# Patient Record
Sex: Female | Born: 1955 | Race: White | Hispanic: No | Smoking: Former smoker
Health system: Southern US, Community
[De-identification: ages and names within clinical notes are randomized; demographics above are authoritative.]

## PROBLEM LIST (undated history)

## (undated) DIAGNOSIS — R61 Generalized hyperhidrosis: Secondary | ICD-10-CM

## (undated) DIAGNOSIS — Z789 Other specified health status: Secondary | ICD-10-CM

## (undated) DIAGNOSIS — R350 Frequency of micturition: Secondary | ICD-10-CM

## (undated) DIAGNOSIS — G4700 Insomnia, unspecified: Secondary | ICD-10-CM

## (undated) DIAGNOSIS — R232 Flushing: Secondary | ICD-10-CM

## (undated) DIAGNOSIS — T7840XA Allergy, unspecified, initial encounter: Secondary | ICD-10-CM

## (undated) DIAGNOSIS — Z9889 Other specified postprocedural states: Secondary | ICD-10-CM

## (undated) DIAGNOSIS — R112 Nausea with vomiting, unspecified: Secondary | ICD-10-CM

## (undated) HISTORY — PX: SHOULDER SURGERY: SHX246

## (undated) HISTORY — PX: OOPHORECTOMY: SHX86

## (undated) HISTORY — PX: UTERINE FIBROID SURGERY: SHX826

## (undated) HISTORY — DX: Generalized hyperhidrosis: R61

## (undated) HISTORY — PX: TUBAL LIGATION: SHX77

## (undated) HISTORY — DX: Frequency of micturition: R35.0

## (undated) HISTORY — DX: Allergy, unspecified, initial encounter: T78.40XA

## (undated) HISTORY — PX: ENDOMETRIAL ABLATION: SHX621

## (undated) HISTORY — DX: Insomnia, unspecified: G47.00

## (undated) HISTORY — DX: Flushing: R23.2

---

## 1997-11-08 ENCOUNTER — Ambulatory Visit (HOSPITAL_COMMUNITY): Admission: RE | Admit: 1997-11-08 | Discharge: 1997-11-08 | Payer: Self-pay | Admitting: Obstetrics and Gynecology

## 1997-11-08 ENCOUNTER — Encounter: Payer: Self-pay | Admitting: Obstetrics and Gynecology

## 1997-11-14 ENCOUNTER — Encounter: Payer: Self-pay | Admitting: Obstetrics and Gynecology

## 1997-11-14 ENCOUNTER — Ambulatory Visit (HOSPITAL_COMMUNITY): Admission: RE | Admit: 1997-11-14 | Discharge: 1997-11-14 | Payer: Self-pay | Admitting: Obstetrics and Gynecology

## 1997-12-05 ENCOUNTER — Other Ambulatory Visit: Admission: RE | Admit: 1997-12-05 | Discharge: 1997-12-05 | Payer: Self-pay | Admitting: Obstetrics and Gynecology

## 1998-03-30 ENCOUNTER — Ambulatory Visit (HOSPITAL_COMMUNITY): Admission: RE | Admit: 1998-03-30 | Discharge: 1998-03-30 | Payer: Self-pay | Admitting: Obstetrics and Gynecology

## 1998-12-26 ENCOUNTER — Other Ambulatory Visit: Admission: RE | Admit: 1998-12-26 | Discharge: 1998-12-26 | Payer: Self-pay | Admitting: Obstetrics and Gynecology

## 2000-02-12 ENCOUNTER — Other Ambulatory Visit: Admission: RE | Admit: 2000-02-12 | Discharge: 2000-02-12 | Payer: Self-pay | Admitting: Obstetrics and Gynecology

## 2000-04-03 ENCOUNTER — Ambulatory Visit (HOSPITAL_COMMUNITY): Admission: RE | Admit: 2000-04-03 | Discharge: 2000-04-03 | Payer: Self-pay | Admitting: Obstetrics and Gynecology

## 2000-04-03 ENCOUNTER — Encounter (INDEPENDENT_AMBULATORY_CARE_PROVIDER_SITE_OTHER): Payer: Self-pay | Admitting: *Deleted

## 2000-04-16 ENCOUNTER — Encounter: Payer: Self-pay | Admitting: Specialist

## 2000-04-23 ENCOUNTER — Ambulatory Visit (HOSPITAL_COMMUNITY): Admission: RE | Admit: 2000-04-23 | Discharge: 2000-04-23 | Payer: Self-pay | Admitting: Specialist

## 2000-04-23 ENCOUNTER — Encounter: Payer: Self-pay | Admitting: Specialist

## 2001-02-23 ENCOUNTER — Other Ambulatory Visit: Admission: RE | Admit: 2001-02-23 | Discharge: 2001-02-23 | Payer: Self-pay | Admitting: Obstetrics and Gynecology

## 2002-05-25 ENCOUNTER — Other Ambulatory Visit: Admission: RE | Admit: 2002-05-25 | Discharge: 2002-05-25 | Payer: Self-pay | Admitting: Obstetrics and Gynecology

## 2003-06-06 ENCOUNTER — Other Ambulatory Visit: Admission: RE | Admit: 2003-06-06 | Discharge: 2003-06-06 | Payer: Self-pay | Admitting: Obstetrics and Gynecology

## 2003-08-20 ENCOUNTER — Encounter: Admission: RE | Admit: 2003-08-20 | Discharge: 2003-08-20 | Payer: Self-pay | Admitting: Specialist

## 2004-05-17 ENCOUNTER — Ambulatory Visit: Payer: Self-pay | Admitting: Internal Medicine

## 2004-05-25 ENCOUNTER — Ambulatory Visit: Payer: Self-pay | Admitting: Internal Medicine

## 2004-08-14 ENCOUNTER — Other Ambulatory Visit: Admission: RE | Admit: 2004-08-14 | Discharge: 2004-08-14 | Payer: Self-pay | Admitting: Obstetrics and Gynecology

## 2005-06-18 ENCOUNTER — Emergency Department (HOSPITAL_COMMUNITY): Admission: EM | Admit: 2005-06-18 | Discharge: 2005-06-18 | Payer: Self-pay | Admitting: Emergency Medicine

## 2005-10-25 ENCOUNTER — Ambulatory Visit (HOSPITAL_COMMUNITY): Admission: RE | Admit: 2005-10-25 | Discharge: 2005-10-25 | Payer: Self-pay | Admitting: Obstetrics and Gynecology

## 2005-11-27 ENCOUNTER — Ambulatory Visit: Payer: Self-pay | Admitting: Family Medicine

## 2006-08-08 ENCOUNTER — Ambulatory Visit (HOSPITAL_COMMUNITY): Admission: RE | Admit: 2006-08-08 | Discharge: 2006-08-08 | Payer: Self-pay | Admitting: Obstetrics & Gynecology

## 2006-10-03 ENCOUNTER — Encounter: Payer: Self-pay | Admitting: Obstetrics & Gynecology

## 2006-10-03 ENCOUNTER — Ambulatory Visit (HOSPITAL_COMMUNITY): Admission: EM | Admit: 2006-10-03 | Discharge: 2006-10-03 | Payer: Self-pay | Admitting: Obstetrics & Gynecology

## 2006-11-11 ENCOUNTER — Ambulatory Visit (HOSPITAL_COMMUNITY): Admission: RE | Admit: 2006-11-11 | Discharge: 2006-11-11 | Payer: Self-pay | Admitting: Obstetrics & Gynecology

## 2007-03-06 ENCOUNTER — Ambulatory Visit (HOSPITAL_COMMUNITY): Admission: RE | Admit: 2007-03-06 | Discharge: 2007-03-06 | Payer: Self-pay | Admitting: Specialist

## 2007-11-24 ENCOUNTER — Ambulatory Visit (HOSPITAL_COMMUNITY): Admission: RE | Admit: 2007-11-24 | Discharge: 2007-11-24 | Payer: Self-pay | Admitting: Obstetrics & Gynecology

## 2008-04-04 IMAGING — CT CT ANGIO CHEST
1 of 2 series · 20 of 30 positions shown · IV contrast (APPLIED)
Comparison: None.

CLINICAL DATA: Chest pain/shortness of breath.
 CT ANGIOGRAPHY OF CHEST:
TECHNIQUE: Multidetector CT imaging of the chest was performed during bolus injection of intravenous contrast.  Multiplanar CT angiographic image reconstructions were generated to evaluate the vascular anatomy.
 Contrast:  80 cc Omnipaque 300

[Series 5: pe 1.0 b20f st · axial · 0.70mm/px · z∈[-289,-35]mm · 20 of 284 slices shown]
[im 15/284  lung]
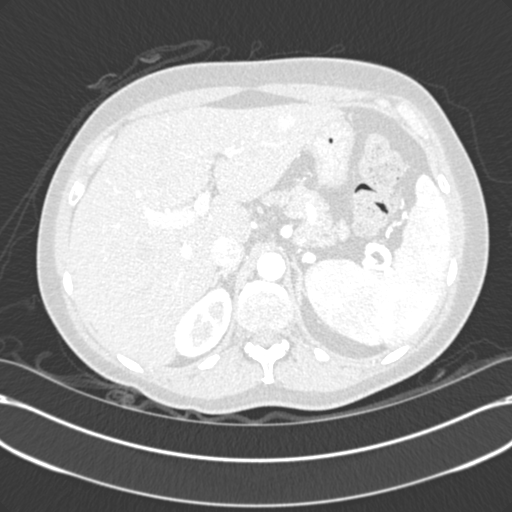
[im 29/284  mediastinal]
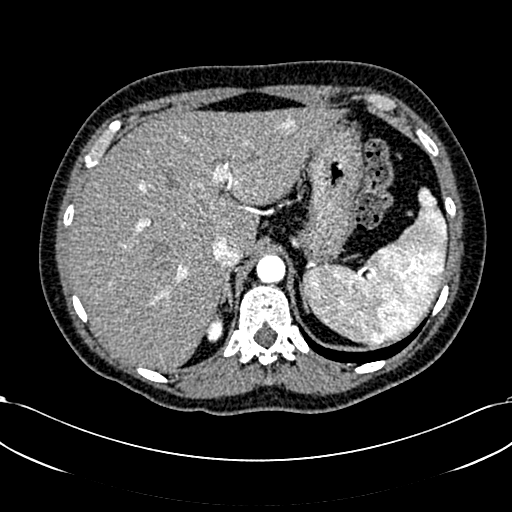
[im 43/284  lung]
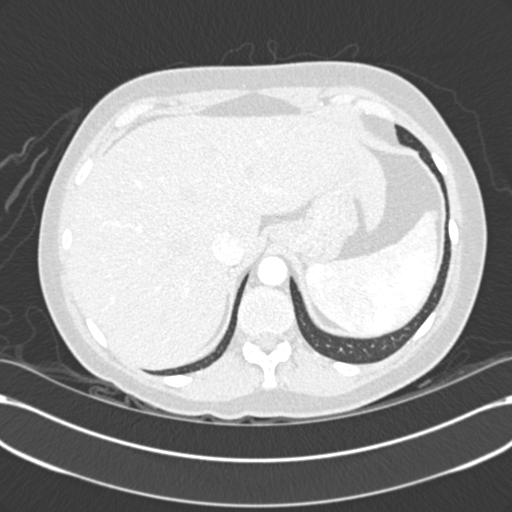
[im 57/284  mediastinal]
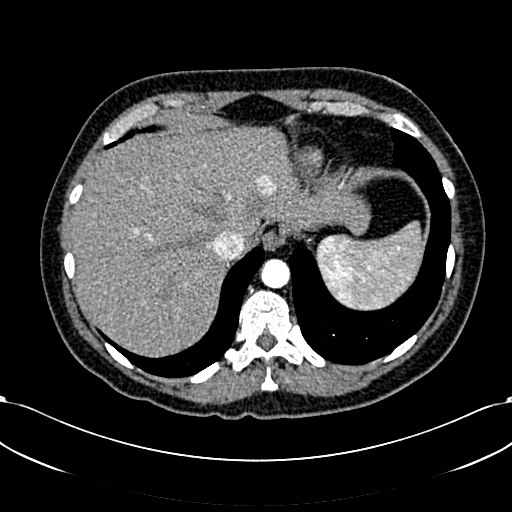
[im 71/284  lung]
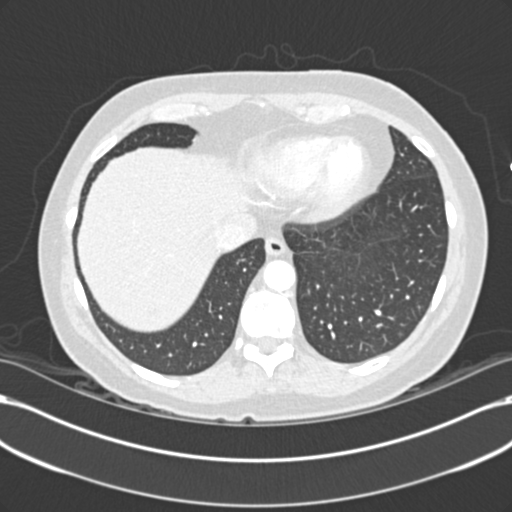
[im 85/284  mediastinal]
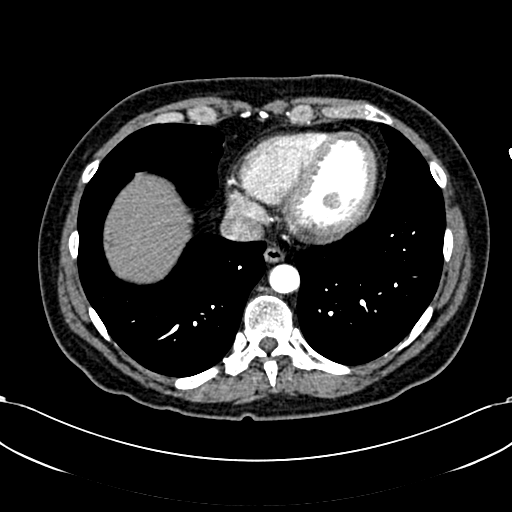
[im 100/284  lung]
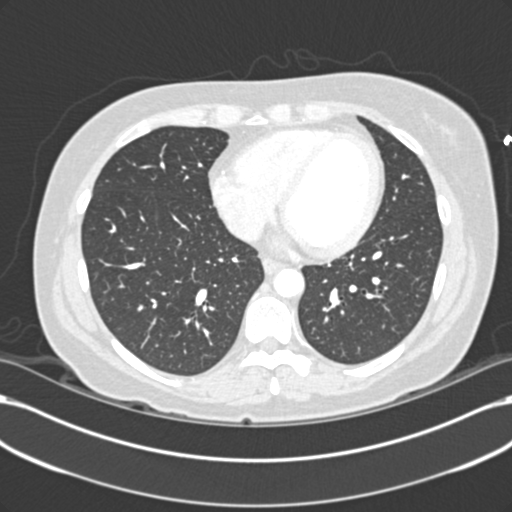
[im 114/284  mediastinal]
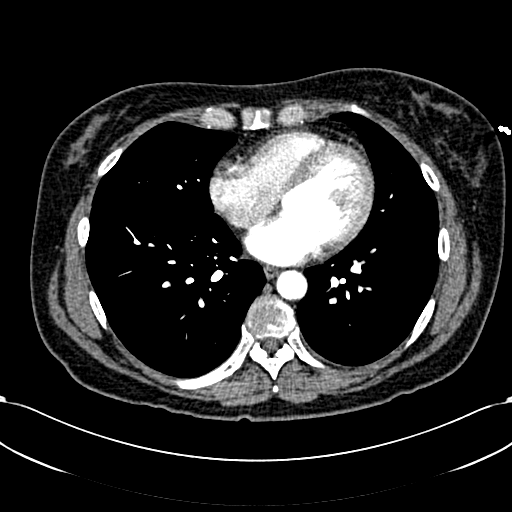
[im 128/284  lung]
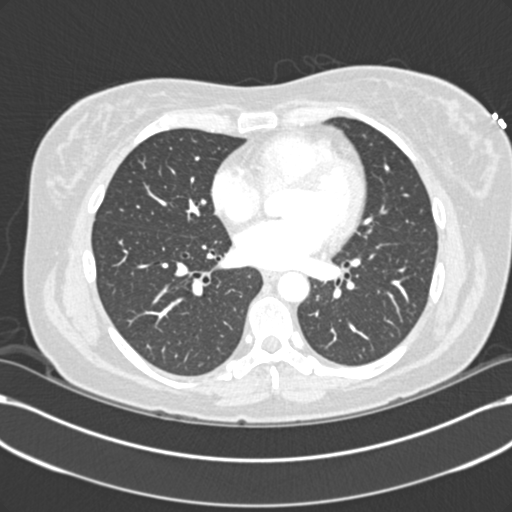
[im 133/284  mediastinal]
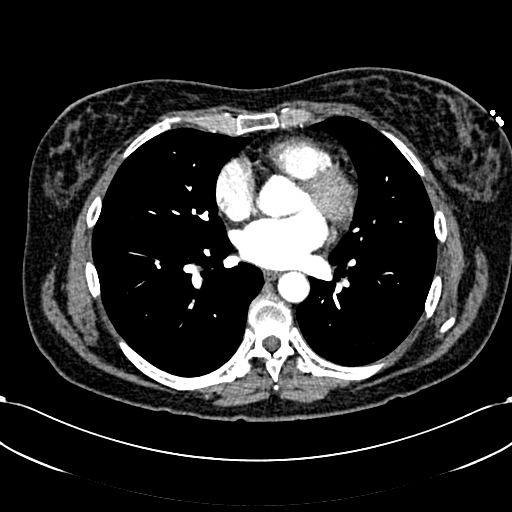
[im 142/284  lung]
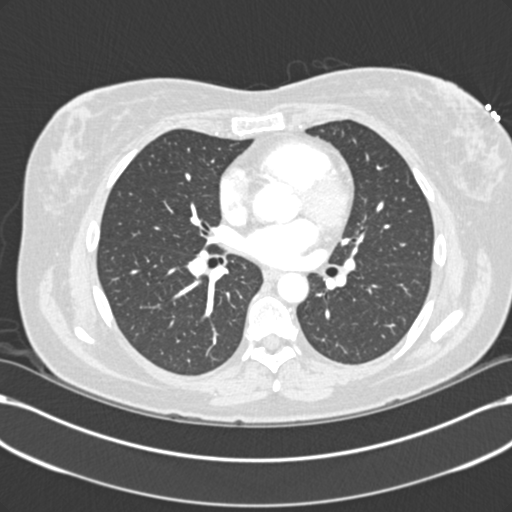
[im 156/284  mediastinal]
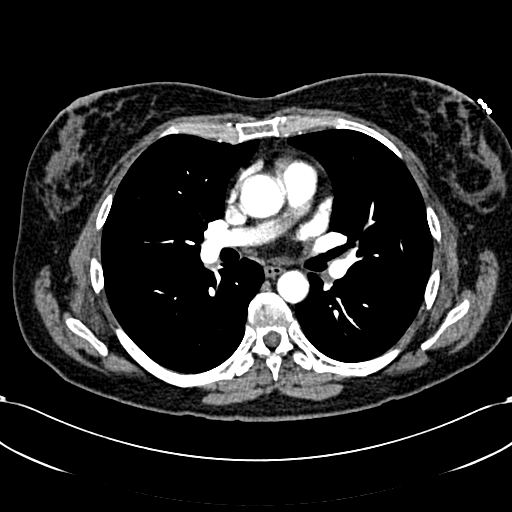
[im 170/284  lung]
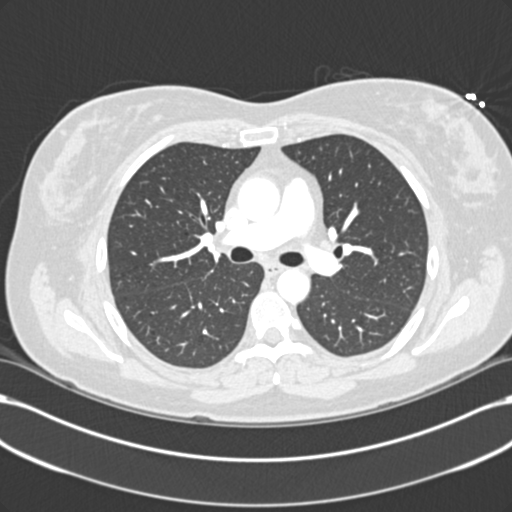
[im 184/284  mediastinal]
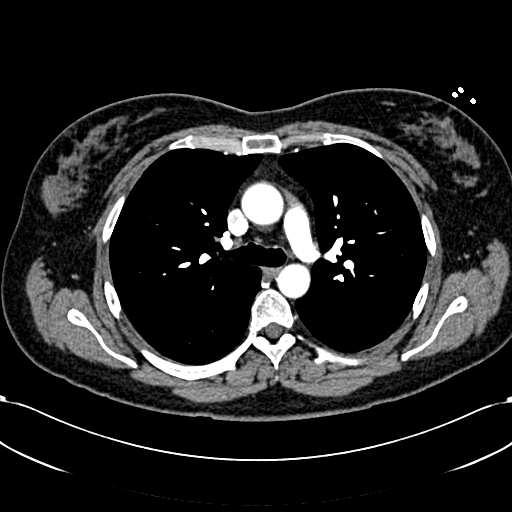
[im 199/284  lung]
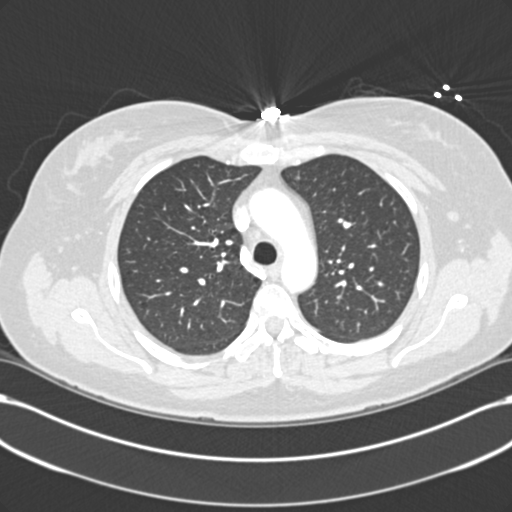
[im 213/284  mediastinal]
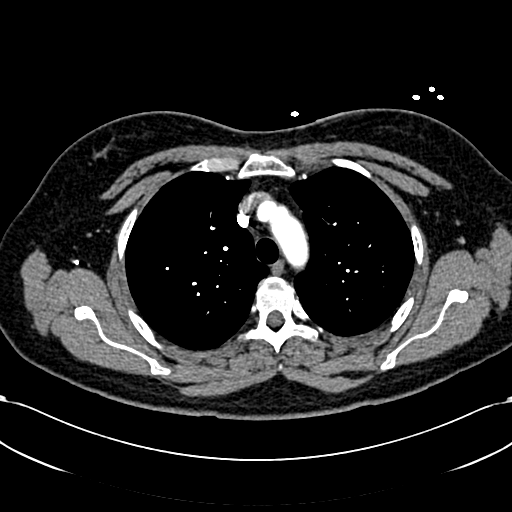
[im 227/284  lung]
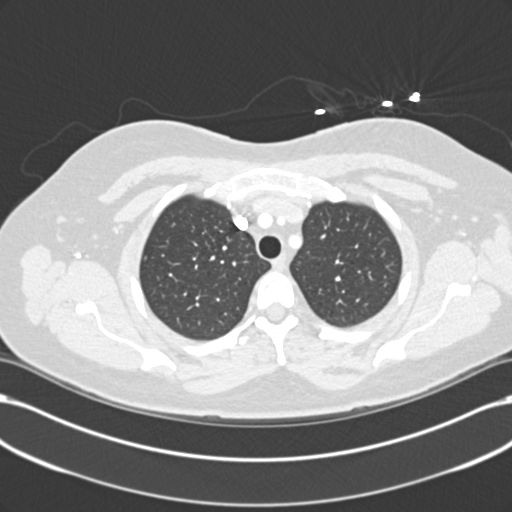
[im 241/284  mediastinal]
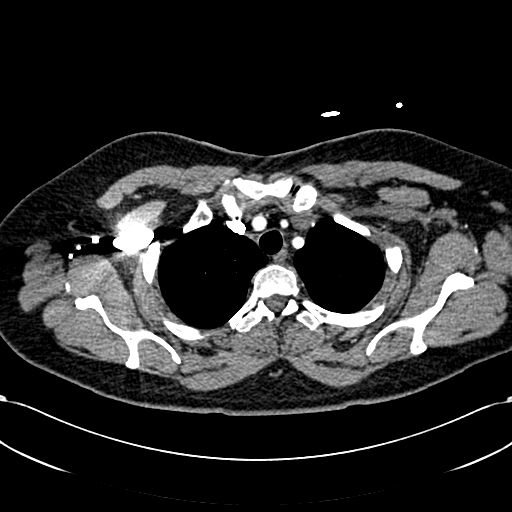
[im 255/284  lung]
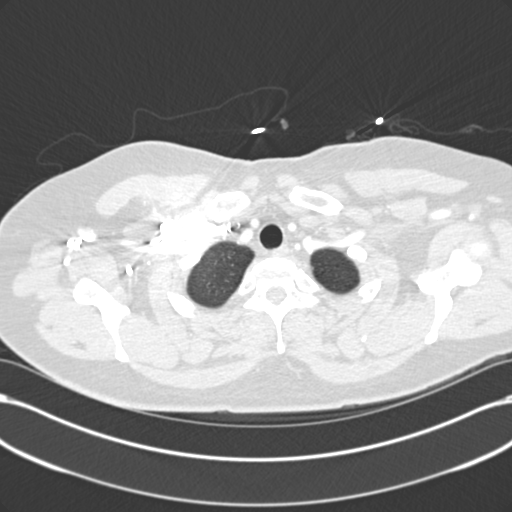
[im 269/284  mediastinal]
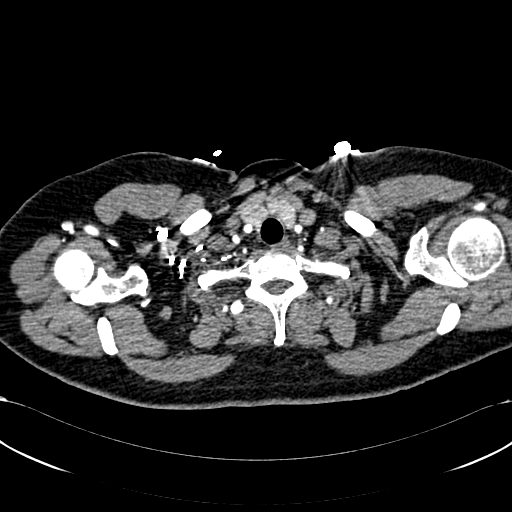

[20 of 30 positions shown; findings below may reference images not displayed]

FINDINGS: In 3 planes, no pulmonary emboli are visualized.
 Thoracic aorta normal. No mediastinal adenopathy. The lungs are clear.
 Lowest cuts include some of the upper abdominal structures.  An 11 mm simple cyst is incidentally noted in the dome of the liver.
IMPRESSION: 1.  No pulmonary emboli.
 2.  Aorta normal.
 3.  Lungs clear.

## 2008-12-13 ENCOUNTER — Ambulatory Visit (HOSPITAL_COMMUNITY): Admission: RE | Admit: 2008-12-13 | Discharge: 2008-12-13 | Payer: Self-pay | Admitting: Obstetrics & Gynecology

## 2009-03-10 ENCOUNTER — Ambulatory Visit (HOSPITAL_COMMUNITY): Admission: RE | Admit: 2009-03-10 | Discharge: 2009-03-10 | Payer: Self-pay | Admitting: Obstetrics & Gynecology

## 2009-05-25 IMAGING — US US PELVIS COMPLETE MODIFY
1 series · 13 of 25 positions shown · non-contrast
Comparison: None.

CLINICAL DATA: Abnormal uterine bleeding.  Previous left oophorectomy for benign ovarian cyst.
TRANSABDOMINAL AND TRANSVAGINAL PELVIC ULTRASOUND:
TECHNIQUE: Both transabdominal and transvaginal ultrasound examinations of the pelvis were performed including evaluation of the uterus, ovaries, adnexal regions, and pelvic cul-de-sac.

[Series 1: us pelvis complete modify · 0.28mm/px · 13 of 44 slices shown]
[im 1/44]
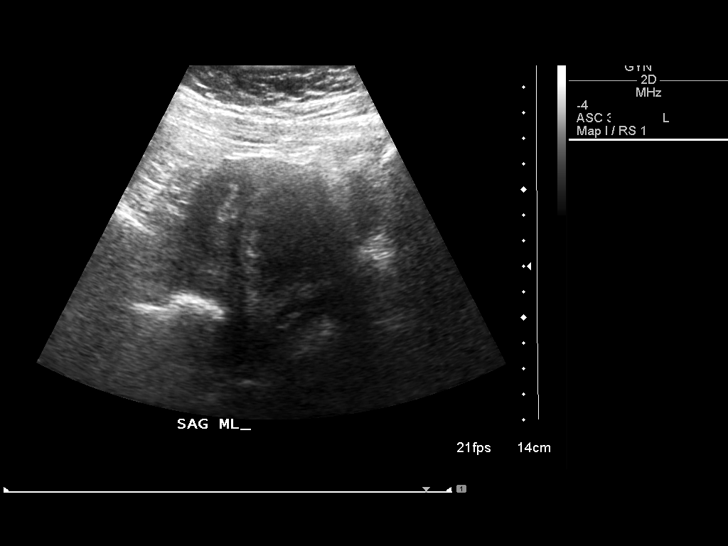
[im 4/44]
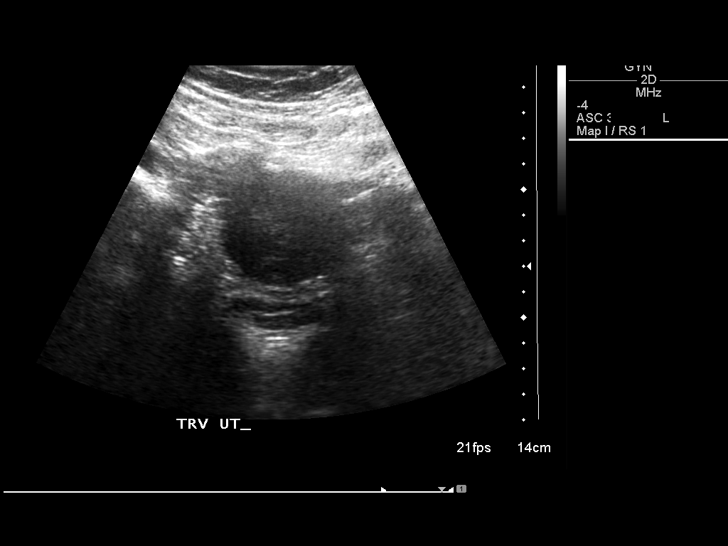
[im 8/44]
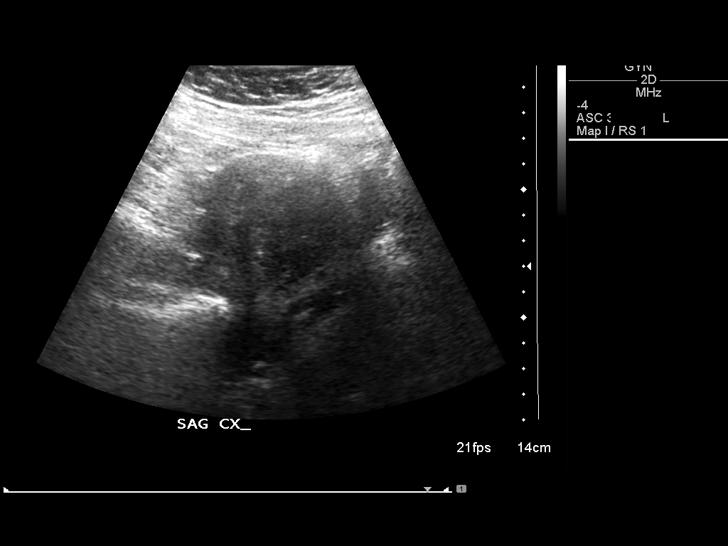
[im 11/44]
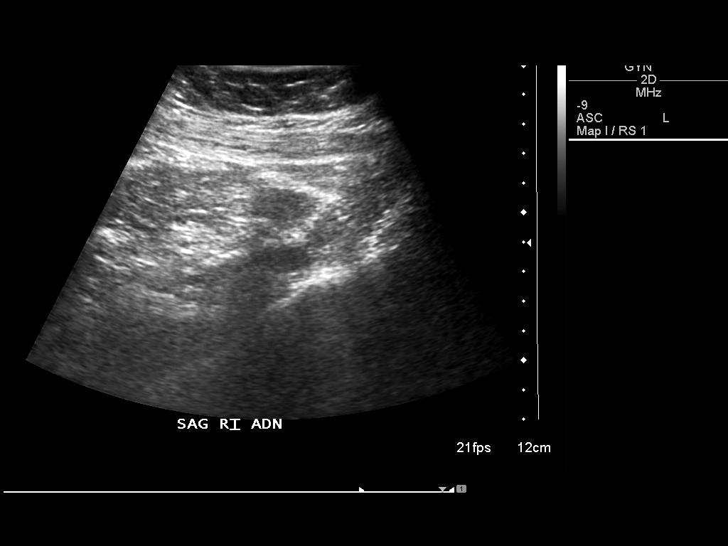
[im 15/44]
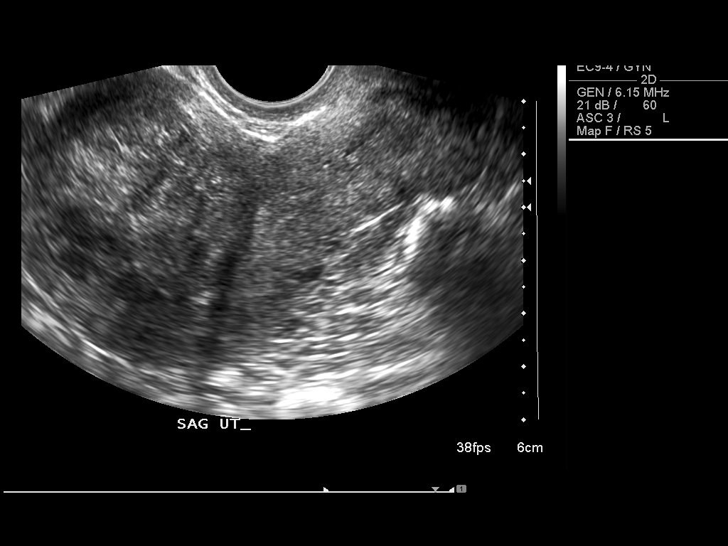
[im 18/44]
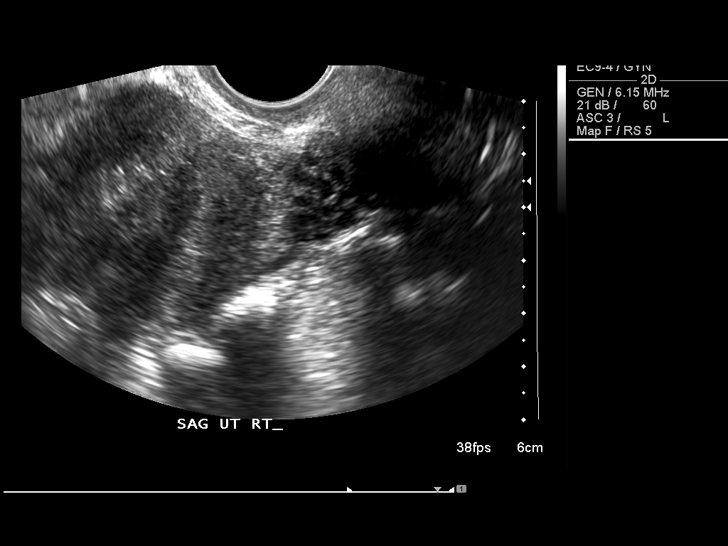
[im 22/44]
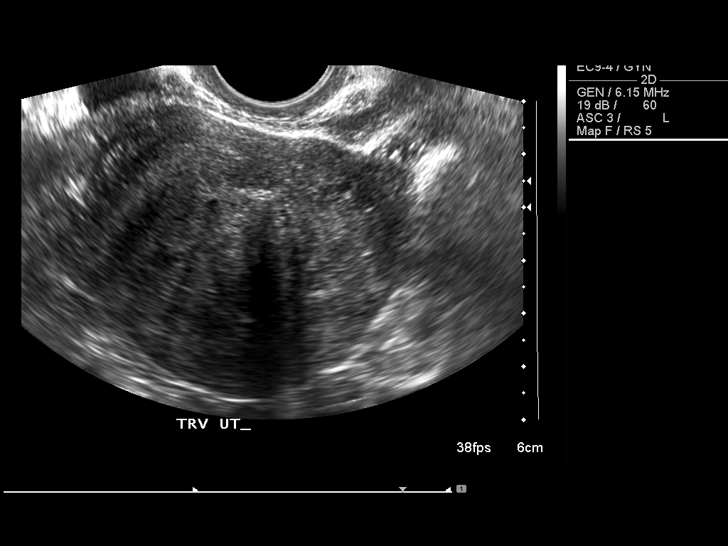
[im 26/44]
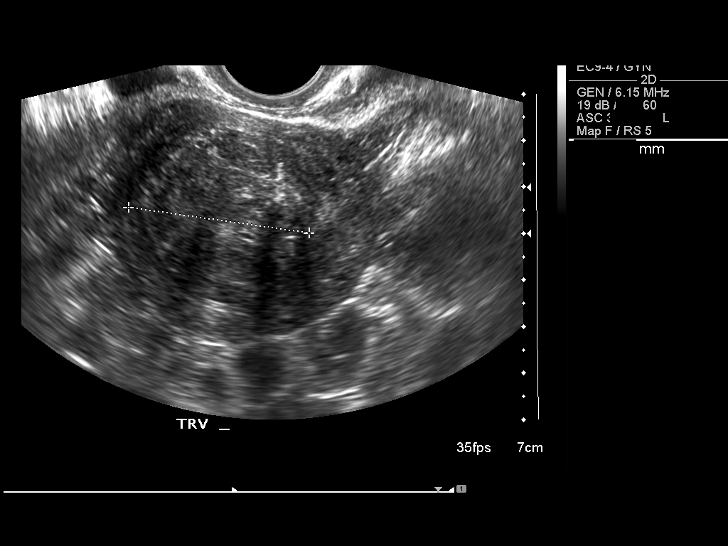
[im 29/44]
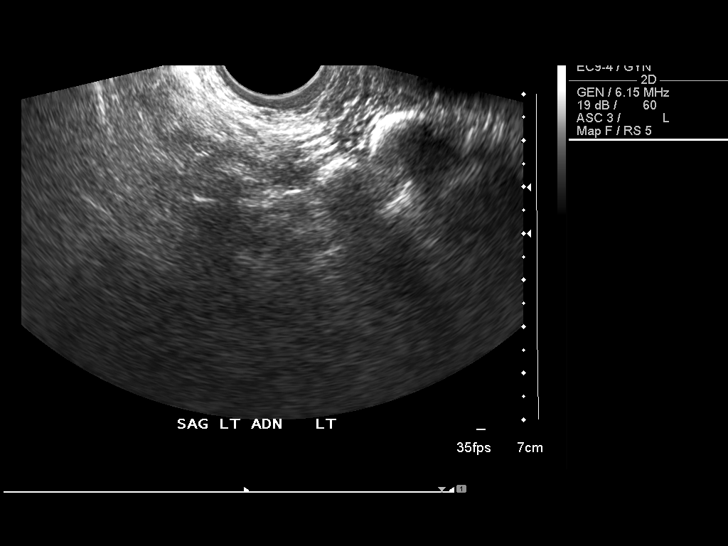
[im 33/44]
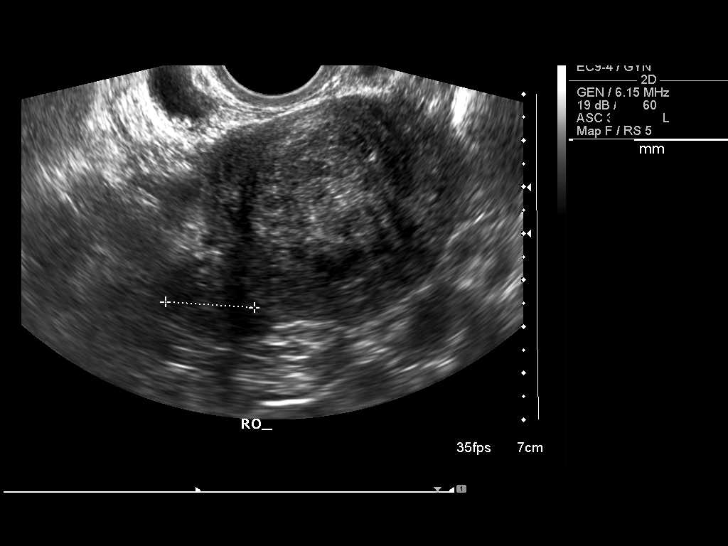
[im 36/44]
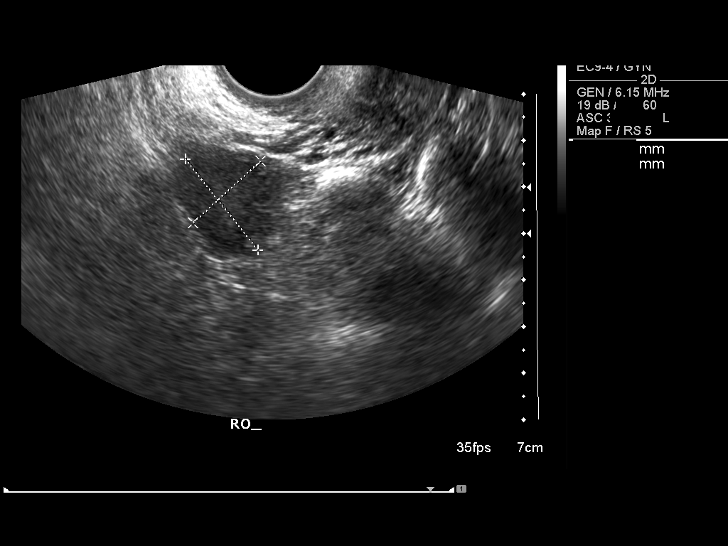
[im 40/44]
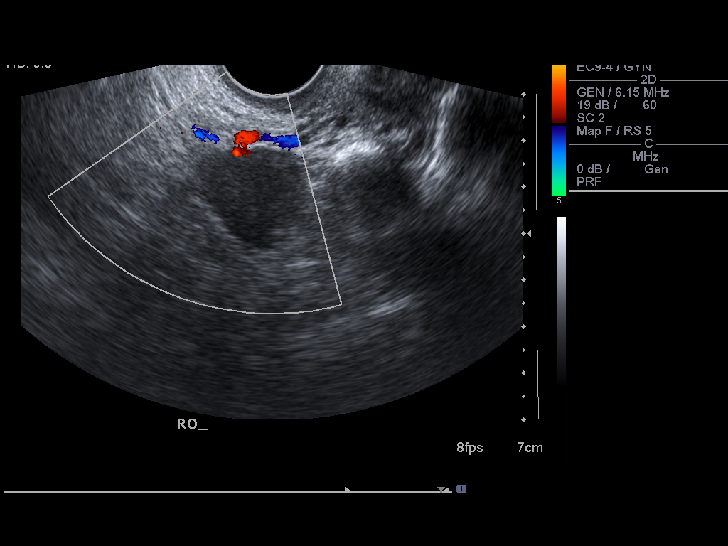
[im 44/44]
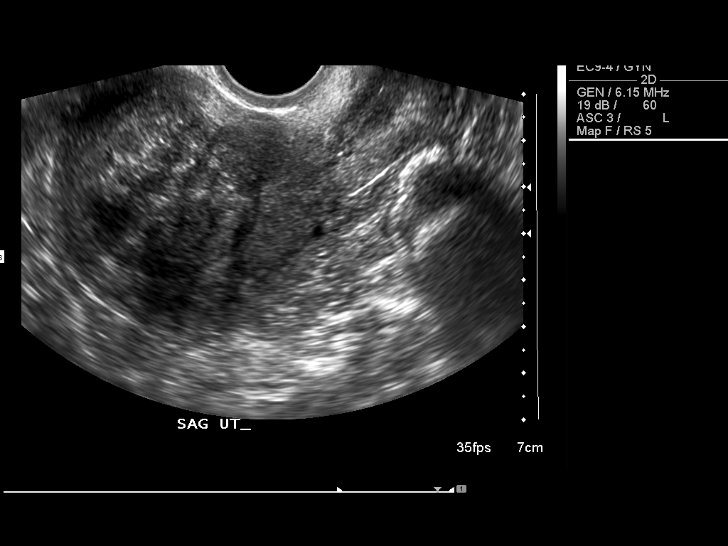

[13 of 25 positions shown; findings below may reference images not displayed]

FINDINGS: The uterus is within normal limits in size measuring approximately 9 x 6 x 6 cm.  A large central fibroid is seen in the anterior uterine body and fundus which has a significant submucosal component displacing the endometrium posteriorly. This measures 4.2 x 3.9 x 3.9 cm.  A 2nd subserosal fibroid is seen projecting from the right uterine fundus which measures 2.2 x 2.3 x 2.1 cm.  
The right ovary is normal in appearance.  Left ovary is not visualized consistent with prior history of left oophorectomy.  No adnexal masses or free fluid are identified by either transabdominal or transvaginal sonography.
IMPRESSION: 1.  Large central uterine fibroid with submucosal component, measuring 4.2 cm.  Second subserosal fibroid in the right uterine fundus measuring 2.3 cm.  
2.  Normal right ovary.  Previous left oophorectomy.

## 2009-12-20 ENCOUNTER — Ambulatory Visit (HOSPITAL_COMMUNITY)
Admission: RE | Admit: 2009-12-20 | Discharge: 2009-12-20 | Payer: Self-pay | Source: Home / Self Care | Attending: Obstetrics & Gynecology | Admitting: Obstetrics & Gynecology

## 2010-02-19 ENCOUNTER — Other Ambulatory Visit: Payer: Self-pay | Admitting: Obstetrics & Gynecology

## 2010-04-04 LAB — CBC
Hemoglobin: 12.4 g/dL (ref 12.0–15.0)
MCV: 88.1 fL (ref 78.0–100.0)
RDW: 13.5 % (ref 11.5–15.5)

## 2010-05-29 NOTE — Op Note (Signed)
NAMESANDEE, Colleen [name] NO.:  [id number]   MEDICAL RECORD NO.:  [id number]          PATIENT TYPE:  AMB   LOCATION:  [facility]                           FACILITY:  [facility]   PHYSICIAN:  Roseanna Rainbow, M.D.DATE OF BIRTH:  08-Jun-1955   DATE OF PROCEDURE:  10/03/2006  DATE OF DISCHARGE:                               OPERATIVE REPORT   PREOPERATIVE DIAGNOSIS:  Abnormal uterine bleeding, small myomatous  uterus.   POSTOPERATIVE DIAGNOSIS:  Abnormal uterine bleeding, small myomatous  uterus.   PROCEDURE:  Dilatation and curettage, diagnostic hysteroscopy, and  NovaSure endometrial ablation.   SURGEON:  Roseanna Rainbow, M.D.   ANESTHESIA:  Managed anesthesia care, paracervical block.   PATHOLOGY:  Endometrial curettings.   ESTIMATED BLOOD LOSS:  Minimal.   COMPLICATIONS:  None.   FINDINGS:  Upon hysteroscopic survey of the endometrial cavity, the left  tubal ostia was well visualized.  There was a left sided anterior wall  submucous myoma that was lateral near the cornu.  There were some the  synechiae in the right cornua. The right tubal ostia was not well  visualized. After the ablation cycle had been completed, the  hysteroscope was reintroduced into the endometrial cavity and it was  found that the endometrium covering the myoma was not cauterized as well  as the left cornu and sidewall of the left cornu.   DESCRIPTION OF PROCEDURE:  The patient was taken to the operating room  with IV fluid running.  She was placed in the dorsal lithotomy position  and prepped and draped in the usual sterile fashion.  After a time out  had been completed, the Sims retractors were placed into the vagina.  The anterior lip of the cervix was infiltrated with 2 mL of 1%  lidocaine.  The single tooth tenaculum was then applied to this  location.  4 mL of 1% lidocaine were then injected at 4 and 7 o'clock to  produce a paracervical block.  The uterus sounded to 10 cm.  The  endocervical canal was measured using a tapered Hegar dilator and this  measurement was 4.5 cm.  The cervix was then dilated with [facility]  dilators.  A diagnostic hysteroscopy was then performed with the above  findings noted.  The hysteroscope was then removed.  The NovaSure device  was then advanced into the uterine fundus.  The device was then seated.  The inner cornual width was measured at 3.2 cm and despite several  attempts, the device would not expand beyond the 3.2 cm.  The cavity  integrity test was then performed and passed.  The ablation cycle was  performed and completed.  The device was then removed.  A repeat  hysteroscopic survey of the endometrial cavity was performed with the  above findings noted.  Minimal deficit was noted for the distending  medium.  The single tooth tenaculum was then removed with minimal  bleeding noted from the cervix.  At the close of the procedure, the  instrument and pack counts were said to be correct x2.  The patient was  taken to the  PACU awake and in stable condition.      Roseanna Rainbow, M.D.  Electronically Signed     LAJ/MEDQ  D:  10/03/2006  T:  10/04/2006  Job:  [id number]

## 2010-05-29 NOTE — Op Note (Signed)
[name], [name] NO.:  [id number]   MEDICAL RECORD NO.:  [id number]          PATIENT TYPE:  AMB   LOCATION:  DAY                          FACILITY:  [facility]   PHYSICIAN:  Jene Every, M.D.    DATE OF BIRTH:  02-12-1955   DATE OF PROCEDURE:  03/06/2007  DATE OF DISCHARGE:                               OPERATIVE REPORT   PREOPERATIVE DIAGNOSIS:  Adhesive capsulitis and impingement syndrome  with possible rotator cuff tear, left shoulder.   POSTOPERATIVE DIAGNOSIS:  Adhesive capsulitis and impingement syndrome  with possible rotator cuff tear, left shoulder.   PROCEDURE PERFORMED:  1. Exam under anesthesia and manipulation under anesthesia of the left      shoulder.  2. Left shoulder arthroscopy, subacromial decompression, bursectomy,      and debridement of labrum.   ANESTHESIA:  General.   SURGEON:  Jene Every, M.D.   ASSISTANT:  Colleen [name], P.A.-C.   BRIEF HISTORY:  The patient is a 51-year with adhesive capsulitis and  impingement syndrome refractory to conservative treatment was indicated  for manipulation, diagnostic arthroscopy, decompression, and  debridement.  The risks and benefits were discussed including bleeding,  infection, damage to neurovascular structures, recurrent adhesive  capsulitis, fracture, need for open repair, etc.   SURGICAL TECHNIQUE:  With the patient in a supine beach chair position  after the induction of adequate anesthesia and 1 gram Kefzol, the left  shoulder and upper extremity was prepped and draped in the usual sterile  fashion.  Just prior to that, manipulation under anesthesia was  performed.  The initial evaluation in comparison to the contralateral  extremity revealed external rotation of 10 degrees, abduction of 70  degrees, and forward flexion of 80 degrees.  With the scapula  stabilized, we first performed a manipulation.  We performed abduction  with appreciation of lysis of adhesions and this was  equivalent to the  contralateral side with abduction to 120 degrees. Forward flexion was  performed to 130 degrees.  I then performed the rotation maneuver.  She  had good internal rotation.  It was mainly external rotation with her in  forward flexion.  We then brought her down to the side and with holding  the humerus more proximal, was able to gently increase the range of  motion to approximately 30 degrees of external rotation.  No trauma was  noted to the humerus.  This was done very gently over a 3-5 minute  period of time.   Next, after improving the range of motion, we proceeded with the  diagnostic arthroscopy. After prepping and draping the upper extremity,  I delineated the acromion and AC joint with a surgical marker.  A  standard posterolateral and anterolateral portal was incised through the  skin only.  We directed the arthroscopic cannula to the glenohumeral  joint in line with the coracoid with the arm in the 70/30 position.  We  penetrated the capsule atraumatically and entered without difficulty.  We removed some synovitis in the glenohumeral joint.  Mild degenerative  fraying of the labrum was noted.  I made an  anterior portal closer to  the anterolateral aspect of the acromion and directed the cannula to the  humeral joint just beneath the biceps.  I introducer the shaver and  utilized it to perform a synovectomy debride the labrum.  There was no  detachment of the labrum.  The biceps tendon was intact.  The rotator  cuff tendon was evaluated and found to be without a tear.  I felt that  there was adequate release of the glenohumeral ligament.  The subscap  was unremarkable.  There was no significant degenerative changes of the  glenohumeral joint.  Next, I redirected the camera in the subacromial  space with the arm in the 0 to 30 degree position.  I used a lateral  portal.  Hypertrophic synovitis was noted.  The shaver was introduced  and utilized to perform a  thorough bursectomy.  I then examined the cuff  anteriorly and posteriorly with rotation, no evidence of a tear was  noted.  I release the CA ligament anteriorly.  There was a small  anterolateral spur and a bur was introduced and utilized to perform  acromioplasty approximately 2-3 mm of the anterior aspect of the  acromion, first delineated the extent of the [facility] joint with a 18 gauge  needle and not violating the [facility] joint.  Next, we reintroduced the  shaver.  Again, a full bursectomy was performed, any bone fragments were  removed, as well.  Full inspection of the rotator cuff revealed no tear.  All the instrumentation was removed.  The portals were closed with 4-0  nylon simple sutures.  0.5% Marcaine with epinephrine was infiltrated  into the joint.  The portals were closed with 4-0 nylon simple sutures.  The patient was given Toradol and a sterile dressing was applied.  She  was placed in a sling, extubated without difficulty, and transferred to  recovery in satisfactory condition.  The patient tolerated the procedure  well, there were no complications.      Jene Every, M.D.  Electronically Signed     JB/MEDQ  D:  03/06/2007  T:  03/06/2007  Job:  [id number]

## 2010-05-29 NOTE — H&P (Signed)
Colleen [name], Colleen [name] NO.:  [id number]   MEDICAL RECORD NO.:  [id number]          PATIENT TYPE:  MAT   LOCATION:  SDC                           FACILITY:  [facility]   PHYSICIAN:  Roseanna Rainbow, M.D.DATE OF BIRTH:  Dec 25, 1955   DATE OF ADMISSION:  DATE OF DISCHARGE:                              HISTORY & PHYSICAL   CHIEF COMPLAINT:  The patient is a 55 year old with abnormal uterine  bleeding and a small fibroid uterus who presents for D&C, hysteroscopy  and NovaSure endometrial ablation.   HISTORY OF PRESENT ILLNESS:  The patient gives a several-month history  of prolonged irregular menses, this has been refractory to attempts to  manage medically with low dose combination oral contraceptive pills.  Workup to date has included a pelvic ultrasound that demonstrated a  small myomatous uterus with two predominant fibroids, a 4 cm submucous  myoma and a 2 cm subserosal myoma.  A CBC in September demonstrated a  hemoglobin of 11.8, a TSH in July of 2008 was normal.   ALLERGIES:  IV contrast.   MEDICATIONS:  Detrol LA.   PAST MEDICAL HISTORY:  Detrusor instability.   PAST SURGICAL HISTORY:  D&C, left oophorectomy, bilateral tubal  ligation, right shoulder scar tissue repair.   SOCIAL HISTORY:  She is single, living with her significant other. She  is employed in [employer]. Has no significant smoking history.  Drinks a  moderate amount of alcoholic beverages.  Denies illicit drug use.   FAMILY HISTORY:  Remarkable for CIDP.   PAST GYN HISTORY:  Please see the above.   REVIEW OF SYSTEMS:  ENDOCRINE:  Hot flashes.  PSYCHIATRIC:  Irritable.   PHYSICAL EXAMINATION:  VITAL SIGNS:  Stable, afebrile.  GENERAL:  Well-developed, well-nourished, no apparent distress.  ABDOMEN:  No organomegaly, nontender.  PELVIC EXAM:  Normal EGBUS. Speculum exam of the vagina is clean, the  cervix is without visible lesions. Bimanual exam - the uterus is  anteverted, upper limits of  normal in size, irregular contour,  nontender. Adnexa are non palpable, nontender.   ASSESSMENT:  Small myomatous uterus with secondary menorrhagia  refractory to attempts to manage medically.   PLAN:  The planned procedure is D&C, hysteroscopy with NovaSure  ablation.  Risks, benefits and alternative forms of management were  reviewed with the patient and informed consent has been obtained.      Roseanna Rainbow, M.D.  Electronically Signed     LAJ/MEDQ  D:  10/02/2006  T:  10/02/2006  Job:  [id number]

## 2010-06-01 NOTE — Group Therapy Note (Signed)
NAME:  Colleen [name], Colleen [name] NO.:  [id number]   MEDICAL RECORD NO.:  [id number]          PATIENT TYPE:  WOC   LOCATION:  [facility]                   FACILITY:  [facility]   PHYSICIAN:  Montey Hora, M.D.    DATE OF BIRTH:  09-16-1955   DATE OF SERVICE:                                    CLINIC NOTE   This is a 55 year old who lost the ability to go to her regular GYN doctors  office in town, so she has come here to get her annual exam.  She is not  really having any major problems.  She is having some hot flashes.  She  occasionally has some vaginal discharge with itching but does not have that  today.  She also has some over active bladder for which she has used Detrol  LA successfully for quite a while and would like a refill of that  prescription.  Her menstrual cycles are about every 28 to 30 days lasting  about 5-7 days.  They are generally heavy and have been for the last several  years.  She did, earlier this year, skip 1-2 menstrual cycles.  LMP was  November 06, 2005.  Menarche was age 42.  Today, she denies any problems  other than some headaches, over active bladder, and vaginal itching.   PAST MEDICAL HISTORY:  Unremarkable.   MEDICATIONS:  Detrol LA 4 mg per day.   ALLERGIES:  SULFA AND IV DYE.  No allergy to latex.   OB HISTORY:  One pregnancy that ended in a miscarriage.   GYN HISTORY:  No abnormal Pap smears or STDs.  Mammogram up to date having  been done in October 2007.  She had a colonoscopy in November 2006.   PAST SURGICAL HISTORY:  Tubal ligation in 2002.  An ovary removed in 2004  for an unknown mass that was benign.  Shoulder surgery in 2004.   FAMILY HISTORY:  Significant for MGF with a heart attack.  Maternal aunt had  breast cancer in her 98s.  She denies any uterine, ovarian, or colon cancer.   SOCIAL HISTORY:  Non-smoker, although she was a smoker five years ago and  smoked 1/2 pack to 1 pack per day.  She does drink about 6 alcoholic  beverages a week and drinks caffeinated beverages.   PHYSICAL EXAMINATION:  VITAL SIGNS:  Pulse 92, blood pressure 138/88, weight 170, height 5 foot 3  inches.  GENERAL:  Well developed, well nourished female.  HEART:  Regular rate and rhythm without murmurs.  LUNGS:  Clear to auscultation and percussion, normal respiratory effort.  NECK:  Without lymphadenopathy or thyromegaly.  BREASTS:  No supraclavicular adenopathy seated, no dimpling or masses,  supine there is no axillary lymphadenopathy and no masses bilaterally.  ABDOMEN:  Soft, nontender, no masses.  PELVIC:  Normal external genitalia, normal vaginal mucosa, normal  nulliparous appearing cervix, quite long vaginal vault.  No adnexal masses  or tenderness bilaterally.  Uterus is anteverted and about six weeks size.   ASSESSMENT/PLAN:  1. Annual exam remarkable for some heavy menstrual cycles.  I discussed  various options and ended up writing prescription for NuvaRing, I      explained how to use, gave three samples and a start up pack.  Plan to      recheck in two months.  If still having heavy menstrual cycles,      certainly would proceed with an endometrial biopsy at that time.  2. Overactive bladder.  I went ahead and renewed her prescription for      Detrol LA 4 mg p.o. daily.           ______________________________  Montey Hora, M.D.     KR/MEDQ  D:  11/27/2005  T:  11/27/2005  Job:  [id number]

## 2010-06-01 NOTE — H&P (Signed)
[facility]  Patient:    Colleen [name], Colleen [name]                        MRN: [id number] Adm. Date:  04/03/00 Attending:  Guy Sandifer. Arleta Creek, M.D.                         History and Physical  CHIEF COMPLAINT:              Ovarian cyst.  HISTORY OF PRESENT ILLNESS:   This patient is a 55 year old, single, white female G1, P0, status post tubal ligation who underwent an ultrasound for complaints of left lower quadrant pain. On February 22, 2000 ultrasound revealed a 2.8 cm mass on the left ovary with some calcifications suspicious for dermoid. Repeat ultrasound on March 25, 2000 reveals the persistence of a probable dermoid cyst of the left ovary; right ovary is normal. There is a suggestion of a possible endometrial polyp on the ultrasound as well. After careful discussion, the patient is admitted for laparoscopy, possible ovarian cystectomy, possible left salpingo-oophorectomy and hysteroscopy with D&C.  PAST MEDICAL HISTORY:         1. Fibrocystic breast disease.                               2. 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                               3. Question of PID in the past.  PAST SURGICAL HISTORY:        Laparoscopy with Filshie clip application March 2000.  OB/GYN HISTORY:               Abortion fifteen years ago.  SOCIAL HISTORY:               The patient smokes one pack of cigarettes a day, denies alcohol or drug abuse.  CURRENT MEDICATIONS:          None.  ALLERGIES:                    SULFA leading to blisters.  FAMILY HISTORY:               Positive for diabetes in maternal grandmother and breast cancer in maternal aunt.  REVIEW OF SYSTEMS:            Negative except as above.  PHYSICAL EXAMINATION:  VITAL SIGNS:                  Height 5 feet 4 inches, weight 157 pounds. Blood pressure 126/76.  HEENT:                        Without thyromegaly.  LUNGS:                        Clear to  auscultation.  CARDIOVASCULAR:               Regular rate and rhythm.  BACK:                         Without costovertebral angle tenderness.  BREASTS:  With bilateral fibrocystic changes without distinct mass, retraction or discharge.  ABDOMEN:                      Soft and nontender without masses.  PELVIC:                       Vulva, vagina and cervix without lesion. Uterus anteverted, normal in size. Adnexa nontender without masses.  EXTREMITIES:                  Grossly within normal limits.  NEUROLOGICAL:                 Grossly within normal limits.  ASSESSMENT:                   1. Left ovarian cyst suspicious for dermoid                                  cyst.                               2. Possible endometrial polyp on ultrasound.  PLAN:                         Laparoscopy, hysteroscopy and dilatation and                           curettage. DD:  04/02/00 TD:  04/02/00 Job: [id number]

## 2010-06-01 NOTE — Op Note (Signed)
[facility]  Patient:    Colleen [name], Colleen [name]                      MRN: [id number] Proc. Date: 04/03/00 Adm. Date:  [id number] Attending:  Cordelia Pen Ii                           Operative Report  PREOPERATIVE DIAGNOSES:       1. Left ovarian cyst.                               2. Rule out endometrial mass.  POSTOPERATIVE DIAGNOSES:      1. Left ovarian cyst.                               2. Endometrial polyp.                               3. Endometriosis.  OPERATION:                    Laparoscopy with left salpingo-oophorectomy, lysis of adhesions, and ablation of endometriosis, and hysteroscopy dilatation and curettage  SURGEON:                      Guy Sandifer. Arleta Creek, M.D.  ANESTHESIA:                   General endotracheal intubation.                               Gretta Cool., M.D.  ESTIMATED BLOOD LOSS:         Less than 100 cc.  I&O:                          Sorbitol distending media 60 cc deficit.  INDICATIONS AND CONSENT:      The patient is a 55 year old single white female, G1, P0, status post tubal ligation with a persistent left ovarian cyst suspicious for a dermoid and findings consistent with a possible endometrial polyp on ultrasound.  Laparoscopy with possible left salpingo-oophorectomy and hysteroscopy with D&C have been discussed.  Possible risks and complications have been discussed including but not limited to infection, bowel, bladder, ureteral damage, bleeding requiring transfusion of blood products with possible transfusion reaction of HIV and hepatitis acquisition, DVT, PE, pneumonia, uterine perforation, hysterectomy.  All questions have been answered, and consent is signed on the chart.  FINDINGS:                     There is a 1 to 2 cm pedunculated endometrial polyp from the left lateral upper endometrial canal.  Abdominally, the upper abdomen and appendix are normal.  The uterus has multiple 1 to  2 cm subserosal fibroids on the fundus.  The right ovary is normal.  Tubes are status post Filshie clip application.  The left ovary is adherent to the left uterosacral ligament with a band of filmy adhesions.  Dark brown to dark red implants consistent with endometriosis are noted along the left uterosacral ligament.  DESCRIPTION OF PROCEDURE:  The patient is taken to the operating room where she is placed in the dorsosupine position where general anesthesia is induced via endotracheal intubation.  She is then placed in the dorsolithotomy position where she is prepped abdominally and vaginally, bladder straight catheterized, and she is draped in a sterile fashion.  Bivalve speculum is placed in the vagina.  The anterior cervical lip is injected with 1% Xylocaine and grasped with a single-tooth tenaculum.  Paracervical block is then placed at 2, 4, 5, 7, 8, and 10 oclock positions with 1% Xylocaine.  The cervix is gently and progressively dilated to a 29 Pratt dilator.  Diagnostic hysteroscope is placed in the cervix and advanced under direct visualization using sorbitol distending media.  The above findings are noted.  The cervix is then progressively dilated to a 33 Pratt dilator.  The resectoscope can be advanced to the level of the internal os but will not pass beyond that.  Therefore, the hysteroscope is withdrawn, and sharp curettage is carried out along the left side of the uterine cavity.  The diagnostic hysteroscope is reintroduced, and careful inspection reveals the polyp to have been completely removed.  The hysteroscope is again removed, and sharp curettage is carried out in the entire uterine cavity.  The single-tooth tenaculum is replaced with a Hulka tenaculum, and attention is turned to the abdomen.  A small infraumbilical incision is made, and a 10 mm disposable trocar sleeve is placed on the first attempt without difficulty.  Placement is verified with laparoscope,  and no damage to surrounding structures is noted. Pneumoperitoneum is induced, and then a suprapubic followed later by a left lower quadrant incision are made after careful transillumination, and 5 mm nondisposable trocar sleeves are placed under direct visualization without difficulty.  The above findings are noted.  The course of the left ureter seems to be well clear of the left tube and ovary.  The skin incision on the umbilical trocar sleeve is then extended approximately 3 to 4 mm.  The 10 mm trocar is then replaced with a 12 mm trocar sleeve in the umbilicus.  This is placed bluntly.  Then using the 5 mm laparoscope through the left lower trocar sleeve, the disposable stapler is used to first take down the infundibulopelvic ligament followed by a second bite along the broad ligament immediately inferior to the ovary on the left side.  The 5 mm laparoscope is withdrawn, and then using the operative laparoscope, the proximal ligaments are cauterized with bipolar cautery, and the specimen is cut free.  The Hulka clip on the left side is also removed.  The Hulka clip and the fallopian tube are removed through the umbilical trocar sleeve.  Then again using the 5 mm laparoscope through the left lateral trocar sleeve, the endocatch is placed through the umbilical trocar sleeve, and the left ovary is removed without difficulty.  Finally switching back to the operative laparoscope again, the areas of endometriosis are cauterized with bipolar cautery.  Care is taken to avoid the course of the ureter.  Copious irrigation is carried out. Pneumoperitoneum is reduced.  The inferior trocar sleeves are removed, and no bleeding is noted from any source.  The umbilical trocar sleeve is then removed.  The deep underlying layers in the umbilical incision are closed with a 0 Vicryl suture with care being taken not to pick up any underlying structures.  Skin incisions are then all closed with Dermabond.   The incisions are injected with 0.5% plain Marcaine.  Hulka tenaculum is then removed, and no bleeding is noted.  All counts are correct.  The patient is awakened and  taken to the recovery room in stable condition. DD:  04/03/00 TD:  04/04/00 Job: [id number]

## 2010-06-01 NOTE — Op Note (Signed)
[facility]  Patient:    Colleen [name], Colleen [name]                     MRN: [id number] Proc. Date: 04/23/00 Attending:  Javier Docker, M.D.                           Operative Report  PREOPERATIVE DIAGNOSIS:  Capsulitis of right shoulder.  POSTOPERATIVE DIAGNOSIS:  Capsulitis of right shoulder.  PROCEDURE:  Right shoulder manipulation under anesthesia.  Subacromion cortical steroid injection with 1% lidocaine and 20 mg of Kenalog.  ANESTHESIA:  General.  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  Operative intervention was indicated for closed manipulation under anesthesia to improve range of motion. Risks and benefits discussed including fracture of the humerus, tear of the rotator cuff, need for repeat manipulation, suboptimal range of motion, etc.  Preoperative examination revealed forward flexion of only 60 degrees, abduction of 60 degrees, internal rotation 30-40 degrees, 0 degrees of external rotation.  TECHNIQUE:  The patient is in supine position.  After the induction of adequate general anesthesia, forward flexion was applied to the humerus with the scapula stabilized.  Local constant gentle pressure, easy lysis of adhesions was appreciated in bringing the patient up into full forward flexion.  Next abduction was performed again with the scapula stabilized. Again, a fairly easy lysis of adhesions to full abduction in comparison to the contralateral side.  Next in adduction, external rotation was performed. Again, an easy assumption of full range of motion was noted in external rotation with lysis of adhesions utilizing the humerus and stabilizing the scapula.  There was some internal rotation deficits that were improved with lysis of adhesions as well in the abducted and the adducted position.   Again, scapula was stabilized at all points.  Following this, radiographs were performed that demonstrated no fracture of this location.  Next the anterior aspect of the subacromion space was prepped in the usual sterile fashion and under sterile conditions, 8 cc of 1% lidocaine and 20 mg of Kenalog was injected in the subacromial space.  Next the patient was awakened without difficulty, transported to recovery room in satisfactory condition.  The patient tolerated the procedure well with no apparent complications. DD:  04/23/00 TD:  04/23/00 Job: [id number]

## 2010-10-05 LAB — HCG, SERUM, QUALITATIVE: Preg, Serum: NEGATIVE

## 2010-10-05 LAB — CBC
MCHC: 35.4
RDW: 13.1

## 2010-10-05 LAB — COMPREHENSIVE METABOLIC PANEL
ALT: 20
AST: 19
BUN: 8
Calcium: 8.9
Glucose, Bld: 92
Potassium: 4.2
Sodium: 135
Total Bilirubin: 0.4
Total Protein: 6.2

## 2010-10-25 LAB — CBC: WBC: 5.9

## 2010-10-25 LAB — PREGNANCY, URINE: Preg Test, Ur: NEGATIVE

## 2011-03-13 ENCOUNTER — Other Ambulatory Visit: Payer: Self-pay | Admitting: Obstetrics & Gynecology

## 2011-03-13 DIAGNOSIS — Z1231 Encounter for screening mammogram for malignant neoplasm of breast: Secondary | ICD-10-CM

## 2011-04-03 ENCOUNTER — Ambulatory Visit (HOSPITAL_COMMUNITY)
Admission: RE | Admit: 2011-04-03 | Discharge: 2011-04-03 | Disposition: A | Discharge status: Home / Self Care | Payer: Managed Care, Other (non HMO) | Source: Ambulatory Visit | Attending: Obstetrics & Gynecology | Admitting: Obstetrics & Gynecology

## 2011-04-03 DIAGNOSIS — Z1231 Encounter for screening mammogram for malignant neoplasm of breast: Secondary | ICD-10-CM | POA: Insufficient documentation

## 2012-01-02 ENCOUNTER — Encounter (HOSPITAL_COMMUNITY): Payer: Self-pay | Admitting: Family Medicine

## 2012-01-02 ENCOUNTER — Emergency Department (HOSPITAL_COMMUNITY)
Admission: EM | Admit: 2012-01-02 | Discharge: 2012-01-03 | Disposition: A | Discharge status: Home / Self Care | Payer: Managed Care, Other (non HMO) | Attending: Emergency Medicine | Admitting: Emergency Medicine

## 2012-01-02 DIAGNOSIS — J029 Acute pharyngitis, unspecified: Secondary | ICD-10-CM | POA: Insufficient documentation

## 2012-01-02 DIAGNOSIS — Z79899 Other long term (current) drug therapy: Secondary | ICD-10-CM | POA: Insufficient documentation

## 2012-01-02 DIAGNOSIS — R040 Epistaxis: Secondary | ICD-10-CM | POA: Insufficient documentation

## 2012-01-02 DIAGNOSIS — J329 Chronic sinusitis, unspecified: Secondary | ICD-10-CM | POA: Insufficient documentation

## 2012-01-02 NOTE — ED Notes (Signed)
Pt still having nose bleed.  Small clots mixed with bright red blood.  Pt has history of sinusitis.

## 2012-01-02 NOTE — ED Notes (Signed)
Patient states that she started having a nosebleed tonight at 7pm and that the nosebleed has not stopped. States she has had more frequent nose bleeds over the past 6 months usually associated with sinus infections.

## 2012-01-03 LAB — CBC WITH DIFFERENTIAL/PLATELET
Basophils Relative: 0 % (ref 0–1)
HCT: 33.5 % — ABNORMAL LOW (ref 36.0–46.0)
Hemoglobin: 11.7 g/dL — ABNORMAL LOW (ref 12.0–15.0)
Lymphocytes Relative: 34 % (ref 12–46)
MCV: 88.2 fL (ref 78.0–100.0)
Monocytes Absolute: 0.4 10*3/uL (ref 0.1–1.0)
Monocytes Relative: 6 % (ref 3–12)
Neutrophils Relative %: 56 % (ref 43–77)
RDW: 12.9 % (ref 11.5–15.5)
WBC: 5.5 10*3/uL (ref 4.0–10.5)

## 2012-01-03 LAB — BASIC METABOLIC PANEL
CO2: 22 mEq/L (ref 19–32)
Calcium: 9.5 mg/dL (ref 8.4–10.5)
Creatinine, Ser: 0.59 mg/dL (ref 0.50–1.10)
GFR calc non Af Amer: >90 mL/min (ref >90)

## 2012-01-03 MED ORDER — AZITHROMYCIN 250 MG PO TABS: 250.0000 mg | ORAL_TABLET | Freq: Every day | ORAL | Status: DC

## 2012-01-03 MED ORDER — OXYMETAZOLINE HCL 0.05 % NA SOLN: 2.0000 | Freq: Two times a day (BID) | NASAL | Status: DC

## 2012-01-03 MED ORDER — DEXAMETHASONE SODIUM PHOSPHATE 10 MG/ML IJ SOLN
10.0000 mg | Freq: Once | INTRAMUSCULAR | Status: AC
  Administered 2012-01-03: 10 mg via INTRAMUSCULAR
  Filled 2012-01-03: qty 1

## 2012-01-03 NOTE — ED Provider Notes (Signed)
Medical screening examination/treatment/procedure(s) were performed by non-physician practitioner and as supervising physician I was immediately available for consultation/collaboration.   Mazzy Santarelli L Aman Bonet, MD 01/03/12 0623 

## 2012-01-03 NOTE — ED Provider Notes (Signed)
History     CSN: [id number]  Arrival date & time 01/02/12  2212   First MD Initiated Contact with Patient 01/02/12 2303      Chief Complaint  Patient presents with  . Epistaxis    (Consider location/radiation/quality/duration/timing/severity/associated sxs/prior treatment) Patient is a 56 y.o. female presenting with nosebleeds. The history is provided by the patient and medical records.  Epistaxis    Colleen [name] is a 56 y.o. female  with a hx of recurrent sinusits presents to the Emergency Department complaining of gradual, persistent, progressively worsening epistaxis onset 4 hours ago. Associated symptoms include sinus pressure and pain, postnasal drip mild sore throat.  Patient states she gets sinusitis a number of times a year for which she is usually treated with a steroid shot and antibiotics.  She states these episodes of sinusitis are most often accompanied by epistaxis. she's never seen an ENT for this. Patient states most of the blood was coming from the left nare however later it was coming from both. Nothing makes it better and nothing makes it worse.  Pt denies , chills, headache, chest pain, cough, chest congestion, abdominal pain, nausea, vomiting, diarrhea.     History reviewed. No pertinent past medical history.  Past Surgical History  Procedure Date  . Tubal ligation   . Shoulder surgery   . Endometrial ablation   . Oophorectomy   . Uterine fibroid surgery     No family history on file.  History  Substance Use Topics  . Smoking status: Never Smoker   . Smokeless tobacco: Not on file  . Alcohol Use: No    OB History    Grav Para Term Preterm Abortions TAB SAB Ect Mult Living                  Review of Systems  Constitutional: Negative for fever, diaphoresis, appetite change, fatigue and unexpected weight change.  HENT: Positive for nosebleeds. Negative for mouth sores and neck stiffness.   Eyes: Negative for visual disturbance.  Respiratory:  Negative for cough, chest tightness, shortness of breath and wheezing.   Cardiovascular: Negative for chest pain.  Gastrointestinal: Negative for nausea, vomiting, abdominal pain, diarrhea and constipation.  Genitourinary: Negative for dysuria, urgency, frequency and hematuria.  Skin: Negative for rash.  Neurological: Negative for syncope, light-headedness and headaches.  Psychiatric/Behavioral: Negative for sleep disturbance. The patient is not nervous/anxious.   All other systems reviewed and are negative.    Allergies  Adhesive; Ivp dye; and Sulfa antibiotics  Home Medications   Current Outpatient Rx  Name  Route  Sig  Dispense  Refill  . CALCIUM-VITAMIN D-VITAMIN K [phone] MG-UNT-MCG PO CHEW   Oral   Chew 1 tablet by mouth every morning.         Marland Kitchen ESTRADIOL 1 MG PO TABS   Oral   Take 1 mg by mouth every morning.         Marland Kitchen LORATADINE-PSEUDOEPHEDRINE ER 5-120 MG PO TB12   Oral   Take 1 tablet by mouth 2 (two) times daily.         Marland Kitchen PROGESTERONE MICRONIZED 100 MG PO CAPS   Oral   Take 100 mg by mouth as directed. Take for 10 days out of the month. Typically on 20th through the 30th         . TESTOSTERONE TD   Transdermal   Place onto the skin every morning. [facility] for the specific compound         .  AZITHROMYCIN 250 MG PO TABS   Oral   Take 1 tablet (250 mg total) by mouth daily. Take first 2 tablets together, then 1 every day until finished.   6 tablet   0   . OXYMETAZOLINE HCL 0.05 % NA SOLN   Nasal   Place 2 sprays into the nose 2 (two) times daily.   30 mL   0     BP 140/98  Pulse 77  Temp 97.5 F (36.4 C) (Oral)  Resp 20  SpO2 100%  Physical Exam  Nursing note and vitals reviewed. Constitutional: She is oriented to person, place, and time. She appears well-developed and well-nourished. No distress.  HENT:  Head: Normocephalic and atraumatic.  Right Ear: Tympanic membrane, external ear and ear canal normal.   Left Ear: Tympanic membrane, external ear and ear canal normal.  Nose: Epistaxis (resolved with visible clot in nares bilaterally) is observed.  No foreign bodies. Right sinus exhibits maxillary sinus tenderness and frontal sinus tenderness. Left sinus exhibits maxillary sinus tenderness and frontal sinus tenderness.  Mouth/Throat: Uvula is midline, oropharynx is clear and moist and mucous membranes are normal. Mucous membranes are not cyanotic. No oropharyngeal exudate, posterior oropharyngeal edema, posterior oropharyngeal erythema or tonsillar abscesses.  Eyes: Conjunctivae normal and EOM are normal. Pupils are equal, round, and reactive to light. No scleral icterus.  Neck: Normal range of motion. Neck supple.  Cardiovascular: Normal rate, regular rhythm, normal heart sounds and intact distal pulses.  Exam reveals no gallop and no friction rub.   No murmur heard. Pulmonary/Chest: Effort normal and breath sounds normal. No respiratory distress. She has no wheezes.  Abdominal: Soft. Bowel sounds are normal. She exhibits no distension and no mass. There is no tenderness. There is no rebound and no guarding.  Musculoskeletal: Normal range of motion. She exhibits no edema and no tenderness.  Neurological: She is alert and oriented to person, place, and time. She exhibits normal muscle tone. Coordination normal.       Speech is clear and goal oriented Moves extremities without ataxia  Skin: Skin is warm and dry. No rash noted. She is not diaphoretic. No erythema.  Psychiatric: She has a normal mood and affect.    ED Course  Procedures (including critical care time)  Labs Reviewed  CBC WITH DIFFERENTIAL - Abnormal; Notable for the following:    RBC 3.80 (*)     Hemoglobin 11.7 (*)     HCT 33.5 (*)     All other components within normal limits  BASIC METABOLIC PANEL - Abnormal; Notable for the following:    Glucose, Bld 107 (*)     All other components within normal limits   Results for  orders placed during the hospital encounter of 01/02/12  CBC WITH DIFFERENTIAL      Component Value Range   WBC 5.5  4.0 - 10.5 K/uL   RBC 3.80 (*) 3.87 - 5.11 MIL/uL   Hemoglobin 11.7 (*) 12.0 - 15.0 g/dL   HCT 95.6 (*) 21.3 - 08.6 %   MCV 88.2  78.0 - 100.0 fL   MCH 30.8  26.0 - 34.0 pg   MCHC 34.9  30.0 - 36.0 g/dL   RDW 57.8  46.9 - 62.9 %   Platelets 214  150 - 400 K/uL   Neutrophils Relative 56  43 - 77 %   Neutro Abs 3.1  1.7 - 7.7 K/uL   Lymphocytes Relative 34  12 - 46 %   Lymphs Abs  1.9  0.7 - 4.0 K/uL   Monocytes Relative 6  3 - 12 %   Monocytes Absolute 0.4  0.1 - 1.0 K/uL   Eosinophils Relative 3  0 - 5 %   Eosinophils Absolute 0.2  0.0 - 0.7 K/uL   Basophils Relative 0  0 - 1 %   Basophils Absolute 0.0  0.0 - 0.1 K/uL  BASIC METABOLIC PANEL      Component Value Range   Sodium 138  135 - 145 mEq/L   Potassium 3.7  3.5 - 5.1 mEq/L   Chloride 106  96 - 112 mEq/L   CO2 22  19 - 32 mEq/L   Glucose, Bld 107 (*) 70 - 99 mg/dL   BUN 17  6 - 23 mg/dL   Creatinine, Ser 1.47  0.50 - 1.10 mg/dL   Calcium 9.5  8.4 - 82.9 mg/dL   GFR calc non Af Amer >90  >90 mL/min   GFR calc Af Amer >90  >90 mL/min   No results found.   No results found.   1. Epistaxis       MDM  Maisey K Kulpa resents with history of epistaxis with sinusitis.  Epistaxis for 4 hours tonight, with resolution prior to evaluation and without intervention.  CBC and CMP unremarkable. Patient with mild anemia at 11.7 which is at patient baseline.  Platelet Number is 214 there is no evidence of concerns for coagulopathy, patient not hypertensive here in the department and without Hx of same.  Patient took aspirin 3 days ago but does not normally take aspirin or other anticoagulants.  We'll treat for possible sinusitis and have her follow with ENT. Also discussed home remedies for stopping epistaxis.  I have also discussed reasons to return immediately to the ER.  Patient expresses understanding and agrees  with plan.  1. Medications: afrin, azithromycin, usual home medications 2. Treatment: rest, drink plenty of fluids, use the afrin for difficult to control nosebleeds 3. Follow Up: Please followup with your primary doctor for discussion of your diagnoses and further evaluation after today's visit; if you do not have a primary care doctor use the resource guide provided to find one; follow-up with ENT.         Dahlia Client Jett Fukuda, PA-C 01/03/12 [phone]

## 2012-06-17 ENCOUNTER — Encounter: Payer: Self-pay | Admitting: Obstetrics & Gynecology

## 2012-06-17 ENCOUNTER — Other Ambulatory Visit: Payer: Self-pay

## 2012-06-17 ENCOUNTER — Ambulatory Visit (INDEPENDENT_AMBULATORY_CARE_PROVIDER_SITE_OTHER): Payer: BC Managed Care – PPO | Admitting: Obstetrics & Gynecology

## 2012-06-17 VITALS — BP 134/77 | HR 79 | Temp 98.1°F | Ht 64.0 in | Wt 159.2 lb

## 2012-06-17 DIAGNOSIS — N950 Postmenopausal bleeding: Secondary | ICD-10-CM

## 2012-06-17 NOTE — Progress Notes (Signed)
.   Subjective:     Colleen [name] is a 57 y.o. female here for a problem exam.  Current complaints: Pt states she has not had a cycle in the last three years and started bleeding on Friday 05/16/12.  Personal health questionnaire reviewed: no.   Gynecologic History Patient's last menstrual period was 06/12/2012. Contraception: none Last Pap: 11/2010. Results were: normal Last mammogram: 11/2010. Results were: normal  Obstetric History OB History   Grav Para Term Preterm Abortions TAB SAB Ect Mult Living                   The following portions of the patient's history were reviewed and updated as appropriate: allergies, current medications, past family history, past medical history, past social history, past surgical history and problem list.  Review of Systems Pertinent items are noted in HPI.    Objective:    General appearance: alert Abdomen: soft, non-tender; bowel sounds normal; no masses,  no organomegaly Pelvic: cervix normal in appearance, external genitalia normal, no adnexal masses or tenderness, no cervical motion tenderness, uterus normal size, shape, and consistency and vagina normal without discharge   The patient was consented for an endometrial biopsy.  The cervix was prepped with betadine.  Two passes were made with the pipelle.  The specimen was sent to Pathology  Assessment:   Postmenopausal bleed  Plan:   Pelvic U/S; return after the pelvic U/S

## 2012-06-19 DIAGNOSIS — N939 Abnormal uterine and vaginal bleeding, unspecified: Secondary | ICD-10-CM | POA: Insufficient documentation

## 2012-06-19 NOTE — Patient Instructions (Signed)
Postmenopausal Bleeding Menopause is commonly referred to as the "change in life." It is a time when the fertile years, the time of ovulating and having menstrual periods, has come to an end. It is also determined by not having menstrual periods for 12 months.  Postmenopausal bleeding is any bleeding a woman has after she has entered into menopause. Any type of postmenopausal bleeding, even if it appears to be a typical menstrual period, is concerning. This should be evaluated by your caregiver.  CAUSES   Hormone therapy.  Cancer of the cervix or cancer of the lining of the uterus (endometrial cancer).  Thinning of the uterine lining (uterine atrophy).  Thyroid diseases.  Certain medicines.  Infection of the uterus or cervix.  Inflammation or irritation of the uterine lining (endometritis).  Estrogen-secreting tumors.  Growths (polyps) on the cervix, uterine lining, or uterus.  Uterine tumors (fibroids).  Being very overweight (obese). DIAGNOSIS  Your caregiver will take a medical history and ask questions. A physical exam will also be performed. Further tests may include:   A transvaginal ultrasound. An ultrasound wand or probe is inserted into your vagina to view the pelvic organs.  A biopsy of the lining of the uterus (endometrium). A sample of the endometrium is removed and examined.  A hysteroscopy. Your caregiver may use an instrument with a light and a camera attached to it (hysteroscope). The hysteroscope is used to look inside the uterus for problems.  A dilation and curettage (D&C). Tissue is removed from the uterine lining to be examined for problems. TREATMENT  Treatment depends on the cause of the bleeding. Some treatments include:   Surgery.  Medicines.  Hormones.  A hysteroscopy or D&C to remove polyps or fibroids.  Changing or stopping a current medicine you are taking. Talk to your caregiver about your specific treatment. HOME CARE INSTRUCTIONS    Maintain a healthy weight.  Keep regular pelvic exams and Pap tests. SEEK MEDICAL CARE IF:   You have bleeding, even if it is light in comparison to your previous periods.  Your bleeding lasts more than 1 week.  You have abdominal pain.  You develop bleeding with sexual intercourse. SEEK IMMEDIATE MEDICAL CARE IF:   You have a fever, chills, headache, dizziness, muscle aches, and bleeding.  You have severe pain with bleeding.  You are passing blood clots.  You have bleeding and need more than 1 pad an hour.  You feel faint. MAKE SURE YOU:  Understand these instructions.  Will watch your condition.  Will get help right away if you are not doing well or get worse. Document Released: 04/10/2005 Document Revised: 03/25/2011 Document Reviewed: 09/06/2010 [employer] Patient Information 2014 [employer].  

## 2012-06-23 ENCOUNTER — Other Ambulatory Visit: Payer: Self-pay | Admitting: *Deleted

## 2012-06-23 DIAGNOSIS — N950 Postmenopausal bleeding: Secondary | ICD-10-CM

## 2012-06-24 ENCOUNTER — Other Ambulatory Visit: Payer: Self-pay | Admitting: Obstetrics & Gynecology

## 2012-06-24 ENCOUNTER — Ambulatory Visit (INDEPENDENT_AMBULATORY_CARE_PROVIDER_SITE_OTHER): Payer: BC Managed Care – PPO

## 2012-06-24 ENCOUNTER — Ambulatory Visit (INDEPENDENT_AMBULATORY_CARE_PROVIDER_SITE_OTHER): Payer: BC Managed Care – PPO | Admitting: Obstetrics & Gynecology

## 2012-06-24 VITALS — BP 141/80 | HR 67 | Temp 96.9°F | Wt 156.0 lb

## 2012-06-24 DIAGNOSIS — N950 Postmenopausal bleeding: Secondary | ICD-10-CM

## 2012-06-24 DIAGNOSIS — D259 Leiomyoma of uterus, unspecified: Secondary | ICD-10-CM

## 2012-07-01 ENCOUNTER — Ambulatory Visit (INDEPENDENT_AMBULATORY_CARE_PROVIDER_SITE_OTHER): Payer: BC Managed Care – PPO | Admitting: Obstetrics & Gynecology

## 2012-07-01 ENCOUNTER — Encounter: Payer: Self-pay | Admitting: Obstetrics & Gynecology

## 2012-07-01 VITALS — BP 125/79 | HR 64 | Temp 97.9°F | Wt 160.0 lb

## 2012-07-01 DIAGNOSIS — N950 Postmenopausal bleeding: Secondary | ICD-10-CM

## 2012-07-01 DIAGNOSIS — D259 Leiomyoma of uterus, unspecified: Secondary | ICD-10-CM

## 2012-07-01 NOTE — Progress Notes (Signed)
Subjective:     Colleen [name] is a 57 y.o. female here for a routine exam.  Current complaints: test results. Pt states she had an endometrial biopsy and an ultrasound.  Personal health questionnaire reviewed: yes.   Gynecologic History Patient's last menstrual period was 06/12/2012. Contraception: tubal ligation Last Pap: 2013. Results were: normal Last mammogram: 2013. Results were: normal  Obstetric History OB History   Grav Para Term Preterm Abortions TAB SAB Ect Mult Living                   The following portions of the patient's history were reviewed and updated as appropriate: allergies, current medications, past family history, past medical history, past social history, past surgical history and problem list.  Review of Systems Pertinent items are noted in HPI.    Objective:     No exam.     Assessment:   Perimenopausal bleeding U/S, pathology reports reviewed  Plan:    Continue expectant management Return in a few months

## 2012-07-02 ENCOUNTER — Ambulatory Visit: Payer: BC Managed Care – PPO | Admitting: Obstetrics & Gynecology

## 2012-07-03 ENCOUNTER — Encounter: Payer: Self-pay | Admitting: Obstetrics & Gynecology

## 2012-07-03 DIAGNOSIS — D259 Leiomyoma of uterus, unspecified: Secondary | ICD-10-CM | POA: Insufficient documentation

## 2012-07-08 ENCOUNTER — Encounter: Payer: Self-pay | Admitting: Obstetrics & Gynecology

## 2012-07-10 ENCOUNTER — Encounter: Payer: Self-pay | Admitting: Obstetrics & Gynecology

## 2012-07-10 NOTE — Progress Notes (Signed)
Pt rescheduled the appointment.

## 2012-08-12 ENCOUNTER — Other Ambulatory Visit: Payer: Self-pay | Admitting: Obstetrics & Gynecology

## 2012-08-12 DIAGNOSIS — Z1231 Encounter for screening mammogram for malignant neoplasm of breast: Secondary | ICD-10-CM

## 2012-08-24 ENCOUNTER — Encounter: Payer: Self-pay | Admitting: Obstetrics & Gynecology

## 2012-08-26 ENCOUNTER — Ambulatory Visit (HOSPITAL_COMMUNITY): Payer: Managed Care, Other (non HMO)

## 2012-08-27 ENCOUNTER — Ambulatory Visit (HOSPITAL_COMMUNITY)
Admission: RE | Admit: 2012-08-27 | Discharge: 2012-08-27 | Disposition: A | Discharge status: Home / Self Care | Payer: BC Managed Care – PPO | Source: Ambulatory Visit | Attending: Obstetrics & Gynecology | Admitting: Obstetrics & Gynecology

## 2012-08-27 DIAGNOSIS — Z1231 Encounter for screening mammogram for malignant neoplasm of breast: Secondary | ICD-10-CM

## 2012-08-31 ENCOUNTER — Other Ambulatory Visit: Payer: Self-pay | Admitting: Obstetrics & Gynecology

## 2012-08-31 DIAGNOSIS — R928 Other abnormal and inconclusive findings on diagnostic imaging of breast: Secondary | ICD-10-CM

## 2012-09-16 ENCOUNTER — Ambulatory Visit
Admission: RE | Admit: 2012-09-16 | Discharge: 2012-09-16 | Disposition: A | Discharge status: Home / Self Care | Payer: Managed Care, Other (non HMO) | Source: Ambulatory Visit | Attending: Obstetrics & Gynecology | Admitting: Obstetrics & Gynecology

## 2012-09-16 DIAGNOSIS — R928 Other abnormal and inconclusive findings on diagnostic imaging of breast: Secondary | ICD-10-CM

## 2012-09-30 ENCOUNTER — Ambulatory Visit: Payer: BC Managed Care – PPO | Admitting: Obstetrics & Gynecology

## 2012-10-01 ENCOUNTER — Ambulatory Visit (INDEPENDENT_AMBULATORY_CARE_PROVIDER_SITE_OTHER): Payer: BC Managed Care – PPO | Admitting: Obstetrics & Gynecology

## 2012-10-01 ENCOUNTER — Encounter: Payer: Self-pay | Admitting: Obstetrics & Gynecology

## 2012-10-01 VITALS — BP 137/83 | HR 60 | Temp 98.2°F | Ht 64.0 in

## 2012-10-01 DIAGNOSIS — N898 Other specified noninflammatory disorders of vagina: Secondary | ICD-10-CM

## 2012-10-01 DIAGNOSIS — N926 Irregular menstruation, unspecified: Secondary | ICD-10-CM

## 2012-10-01 DIAGNOSIS — D259 Leiomyoma of uterus, unspecified: Secondary | ICD-10-CM

## 2012-10-01 DIAGNOSIS — N939 Abnormal uterine and vaginal bleeding, unspecified: Secondary | ICD-10-CM

## 2012-10-01 MED ORDER — CLINDAMYCIN HCL 300 MG PO CAPS: 300.0000 mg | ORAL_CAPSULE | Freq: Two times a day (BID) | ORAL | Status: DC

## 2012-10-01 NOTE — Progress Notes (Signed)
.   Subjective:     Colleen [name] is a 57 y.o. female here for a consult/follow up.  Current complaints: Patient is still having monthly cycles - lasting for about 5 to 8 days.  She would like to discuss a possible hysterectomy.  Personal health questionnaire reviewed: no.   Gynecologic History Patient's last menstrual period was 09/02/2012. Contraception: none Last Pap: 2013. Results were: normal Last mammogram: 2014. Results were: normal  Obstetric History OB History  No data available     The following portions of the patient's history were reviewed and updated as appropriate: allergies, current medications, past family history, past medical history, past social history, past surgical history and problem list.  Review of Systems Pertinent items are noted in HPI.    Objective:   General:  alert     Abdomen: soft, non-tender; bowel sounds normal; no masses,  no organomegaly   Vulva:  normal  Vagina: White, thin vaginal discharge  Cervix:  no lesions  Corpus: 10 weeks size, irregular contour, position, consistency, mobility, non-tender, anterior myoma--mobile  Adnexa:  normal adnexa    Assessment:    AUB-L Possible vaginitis   Plan:   A TRH is planned.  Risks/benefits reviewed. Treat empirically with Clindamycin

## 2012-10-02 ENCOUNTER — Encounter: Payer: Self-pay | Admitting: Obstetrics & Gynecology

## 2012-10-02 ENCOUNTER — Other Ambulatory Visit: Payer: Self-pay | Admitting: *Deleted

## 2012-10-02 NOTE — Patient Instructions (Signed)
Hysterectomy Information  A hysterectomy is a procedure where your uterus is surgically removed. It will no longer be possible to have menstrual periods or to become pregnant. The tubes and ovaries can be removed (bilateral salpingo-oopherectomy) during this surgery as well.  REASONS FOR A HYSTERECTOMY  Persistent, abnormal bleeding.  Lasting (chronic) pelvic pain or infection.  The lining of the uterus (endometrium) starts growing outside the uterus (endometriosis).  The endometrium starts growing in the muscle of the uterus (adenomyosis).  The uterus falls down into the vagina (pelvic organ prolapse).  Symptomatic uterine fibroids.  Precancerous cells.  Cervical cancer or uterine cancer. TYPES OF HYSTERECTOMIES  Supracervical hysterectomy. This type removes the top part of the uterus, but not the cervix.  Total hysterectomy. This type removes the uterus and cervix.  Radical hysterectomy. This type removes the uterus, cervix, and the fibrous tissue that holds the uterus in place in the pelvis (parametrium). WAYS A HYSTERECTOMY CAN BE PERFORMED  Abdominal hysterectomy. A large surgical cut (incision) is made in the abdomen. The uterus is removed through this incision.  Vaginal hysterectomy. An incision is made in the vagina. The uterus is removed through this incision. There are no abdominal incisions.  Conventional laparoscopic hysterectomy. A thin, lighted tube with a camera (laparoscope) is inserted into 3 or 4 small incisions in the abdomen. The uterus is cut into small pieces. The small pieces are removed through the incisions, or they are removed through the vagina.  Laparoscopic assisted vaginal hysterectomy (LAVH). Three or four small incisions are made in the abdomen. Part of the surgery is performed laparoscopically and part vaginally. The uterus is removed through the vagina.  Robot-assisted laparoscopic hysterectomy. A laparoscope is inserted into 3 or 4 small  incisions in the abdomen. A computer-controlled device is used to give the surgeon a 3D image. This allows for more precise movements of surgical instruments. The uterus is cut into small pieces and removed through the incisions or removed through the vagina. RISKS OF HYSTERECTOMY   Bleeding and risk of blood transfusion. Tell your caregiver if you do not want to receive any blood products.  Blood clots in the legs or lung.  Infection.  Injury to surrounding organs.  Anesthesia problems or side effects.  Conversion to an abdominal hysterectomy. WHAT TO EXPECT AFTER A HYSTERECTOMY  You will be given pain medicine.  You will need to have someone with you for the first 3 to 5 days after you go home.  You will need to follow up with your surgeon in 2 to 4 weeks after surgery to evaluate your progress.  You may have early menopause symptoms like hot flashes, night sweats, and insomnia.  If you had a hysterectomy for a problem that was not a cancer or a condition that could lead to cancer, then you no longer need Pap tests. However, even if you no longer need a Pap test, a regular exam is a good idea to make sure no other problems are starting. Document Released: 06/26/2000 Document Revised: 03/25/2011 Document Reviewed: 08/11/2010 Exit[employer] Patient Information 2014 [employer].  

## 2012-10-03 LAB — WET PREP BY MOLECULAR PROBE
Candida species: NEGATIVE
Gardnerella vaginalis: NEGATIVE
Trichomonas vaginosis: NEGATIVE

## 2012-10-16 ENCOUNTER — Encounter: Payer: Self-pay | Admitting: Obstetrics & Gynecology

## 2012-12-28 ENCOUNTER — Encounter (HOSPITAL_COMMUNITY): Payer: Self-pay | Admitting: Pharmacist

## 2012-12-29 ENCOUNTER — Encounter (HOSPITAL_COMMUNITY)
Admission: RE | Admit: 2012-12-29 | Discharge: 2012-12-29 | Disposition: A | Discharge status: Home / Self Care | Payer: BC Managed Care – PPO | Source: Ambulatory Visit | Attending: Obstetrics & Gynecology | Admitting: Obstetrics & Gynecology

## 2012-12-29 ENCOUNTER — Other Ambulatory Visit: Payer: Self-pay | Admitting: *Deleted

## 2012-12-29 ENCOUNTER — Encounter (HOSPITAL_COMMUNITY): Payer: Self-pay

## 2012-12-29 HISTORY — DX: Other specified postprocedural states: Z98.890

## 2012-12-29 HISTORY — DX: Other specified postprocedural states: R11.2

## 2012-12-29 HISTORY — DX: Other specified health status: Z78.9

## 2012-12-29 LAB — CBC
HCT: 36.5 % (ref 36.0–46.0)
Hemoglobin: 12.7 g/dL (ref 12.0–15.0)
MCH: 30.8 pg (ref 26.0–34.0)
MCHC: 34.8 g/dL (ref 30.0–36.0)

## 2012-12-29 MED ORDER — METRONIDAZOLE 500 MG PO TABS: 500.0000 mg | ORAL_TABLET | Freq: Two times a day (BID) | ORAL | Status: DC

## 2012-12-29 NOTE — Patient Instructions (Signed)
20 Colleen [name]  12/29/2012   Your procedure is scheduled on:  01/01/13  Enter through the Main Entrance of [facility] at 6 AM.  Pick up the phone at the desk and dial [phone].   Call this number if you have problems the morning of surgery: [phone]   Remember:   Do not eat food:After Midnight.  Do not drink clear liquids: After Midnight.  Take these medicines the morning of surgery with A SIP OF WATER: NA   Do not wear jewelry, make-up or nail polish.  Do not wear lotions, powders, or perfumes. You may wear deodorant.  Do not shave 48 hours prior to surgery.  Do not bring valuables to the hospital.  [facility] is not   responsible for any belongings or valuables brought to the hospital.  Contacts, dentures or bridgework may not be worn into surgery.  Leave suitcase in the car. After surgery it may be brought to your room.  For patients admitted to the hospital, checkout time is 11:00 AM the day of              discharge.   Patients discharged the day of surgery will not be allowed to drive             home.  Name and phone number of your driver: NA  Special Instructions:   Shower using CHG 2 nights before surgery and the night before surgery.  If you shower the day of surgery use CHG.  Use special wash - you have one bottle of CHG for all showers.  You should use approximately 1/3 of the bottle for each shower.   Please read over the following fact sheets that you were given:   Surgical Site Infection Prevention

## 2012-12-30 NOTE — H&P (Signed)
  Subjective:  Colleen [name] is a 57 y.o. gravida female.  She carries a diagnosis of uterine fibroids and has a long history or associated abnormal uterine bleeding.   Evaluation to date: D & C: negative, hysteroscopy: negative and pelvic ultrasound: positive for uterine fibroids. Treatment to date: myomectomy, medical management and endometrial ablation.  Pertinent Gyn History:  Menses : heavy Bleeding: see above Contraception: tubal ligation DES exposure: denies Blood transfusions: none STDs: no past history Preventive screening:  Last mammogram: normal  Last pap: normal Date: 11/12  Patient Active Problem List   Diagnosis Date Noted  . Leiomyoma of uterus, unspecified 07/03/2012  . Abnormal uterine bleeding 06/19/2012   Past Medical History  Diagnosis Date  . PONV (postoperative nausea and vomiting)     severe N/V with last surgery  . Medical history non-contributory     Past Surgical History  Procedure Laterality Date  . Tubal ligation    . Shoulder surgery    . Endometrial ablation    . Oophorectomy    . Uterine fibroid surgery      No prescriptions prior to admission   Allergies  Allergen Reactions  . Hydrocodone-Acetaminophen Nausea And Vomiting  . Sulfa Antibiotics Hives  . Adhesive [Tape] Rash  . Ivp Dye [Iodinated Diagnostic Agents] Rash    History  Substance Use Topics  . Smoking status: Former Games developer  . Smokeless tobacco: Not on file  . Alcohol Use: 0.6 oz/week    1 Glasses of wine per week     Comment: occasional wine    No family history on file.   Review of Systems Pertinent items are noted in HPI.    Objective:   Vital signs in last 24 hours: Pulse Rate:  [69] 69 (12/16 1057) Resp:  [18] 18 (12/16 1057) BP: (129)/(81) 129/81 mmHg (12/16 1057) SpO2:  [100 %] 100 % (12/16 1057) Weight:  [72.122 kg (159 lb)] 72.122 kg (159 lb) (12/16 1057)     General appearance: alert Breasts: normal appearance, no masses or tenderness Abdomen: soft,  non-tender; bowel sounds normal; no masses,  no organomegaly Pelvic: cervix normal in appearance, external genitalia normal, no adnexal masses or tenderness, uterus about 10 - 12 weeks size in aggregate, shape, and consistency and vagina normal without discharge    Assessment/Plan:  Perimenopausal with AUB--L,O  An attempted TRH/salpingectomy is planned  I had a lengthy discussion with the patient regarding her bleeding and consideration for hysterectomy.  Procedure, risks, reasons, benefits and complications (including injury to bowel, bladder, major blood vessel, ureter, bleeding, possibility of transfusion, infection, thromboembolism or fistula formation) were reviewed in detail. Consent was signed and preop testing was ordered.  Instructions were reviewed, including NPO after midnight.

## 2012-12-31 MED ORDER — METRONIDAZOLE IN NACL 5-0.79 MG/ML-% IV SOLN
500.0000 mg | Freq: Once | INTRAVENOUS | Status: AC
  Administered 2013-01-01: .5 g via INTRAVENOUS
  Filled 2012-12-31: qty 100

## 2012-12-31 MED ORDER — CEFAZOLIN SODIUM-DEXTROSE 2-3 GM-% IV SOLR
2.0000 g | INTRAVENOUS | Status: AC
  Administered 2013-01-01: 2 g via INTRAVENOUS

## 2013-01-01 ENCOUNTER — Encounter (HOSPITAL_COMMUNITY): Payer: BC Managed Care – PPO | Admitting: Anesthesiology

## 2013-01-01 ENCOUNTER — Encounter (HOSPITAL_COMMUNITY)
Admission: RE | Disposition: A | Discharge status: Home / Self Care | Payer: Self-pay | Source: Ambulatory Visit | Attending: Obstetrics & Gynecology

## 2013-01-01 ENCOUNTER — Ambulatory Visit (HOSPITAL_COMMUNITY)
Admission: RE | Admit: 2013-01-01 | Discharge: 2013-01-03 | Disposition: A | Discharge status: Home / Self Care | Payer: BC Managed Care – PPO | Source: Ambulatory Visit | Attending: Obstetrics & Gynecology | Admitting: Obstetrics & Gynecology

## 2013-01-01 ENCOUNTER — Encounter (HOSPITAL_COMMUNITY): Payer: Self-pay | Admitting: *Deleted

## 2013-01-01 ENCOUNTER — Ambulatory Visit (HOSPITAL_COMMUNITY): Payer: BC Managed Care – PPO | Admitting: Anesthesiology

## 2013-01-01 DIAGNOSIS — D252 Subserosal leiomyoma of uterus: Secondary | ICD-10-CM | POA: Insufficient documentation

## 2013-01-01 DIAGNOSIS — N840 Polyp of corpus uteri: Secondary | ICD-10-CM | POA: Insufficient documentation

## 2013-01-01 DIAGNOSIS — Z87891 Personal history of nicotine dependence: Secondary | ICD-10-CM | POA: Insufficient documentation

## 2013-01-01 DIAGNOSIS — D259 Leiomyoma of uterus, unspecified: Secondary | ICD-10-CM | POA: Diagnosis present

## 2013-01-01 DIAGNOSIS — N939 Abnormal uterine and vaginal bleeding, unspecified: Secondary | ICD-10-CM

## 2013-01-01 DIAGNOSIS — N8000 Endometriosis of the uterus, unspecified: Secondary | ICD-10-CM | POA: Insufficient documentation

## 2013-01-01 DIAGNOSIS — D251 Intramural leiomyoma of uterus: Secondary | ICD-10-CM | POA: Insufficient documentation

## 2013-01-01 DIAGNOSIS — N938 Other specified abnormal uterine and vaginal bleeding: Secondary | ICD-10-CM | POA: Insufficient documentation

## 2013-01-01 DIAGNOSIS — N926 Irregular menstruation, unspecified: Secondary | ICD-10-CM

## 2013-01-01 DIAGNOSIS — N949 Unspecified condition associated with female genital organs and menstrual cycle: Secondary | ICD-10-CM | POA: Insufficient documentation

## 2013-01-01 HISTORY — PX: ROBOTIC ASSISTED TOTAL HYSTERECTOMY: SHX6085

## 2013-01-01 HISTORY — PX: LAPAROSCOPIC BILATERAL SALPINGECTOMY: SHX5889

## 2013-01-01 SURGERY — ROBOTIC ASSISTED TOTAL HYSTERECTOMY
Anesthesia: General | Site: Abdomen | Laterality: Right

## 2013-01-01 MED ORDER — PROPOFOL 10 MG/ML IV BOLUS
INTRAVENOUS | Status: DC | PRN
  Administered 2013-01-01: 150 mg via INTRAVENOUS

## 2013-01-01 MED ORDER — METOCLOPRAMIDE HCL 5 MG/ML IJ SOLN: 10.0000 mg | Freq: Once | INTRAMUSCULAR | Status: DC | PRN

## 2013-01-01 MED ORDER — ROCURONIUM BROMIDE 100 MG/10ML IV SOLN
INTRAVENOUS | Status: AC
  Filled 2013-01-01: qty 1

## 2013-01-01 MED ORDER — KETOROLAC TROMETHAMINE 30 MG/ML IJ SOLN
15.0000 mg | Freq: Four times a day (QID) | INTRAMUSCULAR | Status: DC
  Filled 2013-01-01: qty 1

## 2013-01-01 MED ORDER — BUPIVACAINE HCL (PF) 0.25 % IJ SOLN
INTRAMUSCULAR | Status: AC
  Filled 2013-01-01: qty 30

## 2013-01-01 MED ORDER — MIDAZOLAM HCL 2 MG/2ML IJ SOLN
INTRAMUSCULAR | Status: AC
  Filled 2013-01-01: qty 2

## 2013-01-01 MED ORDER — ACETAMINOPHEN 10 MG/ML IV SOLN
1000.0000 mg | Freq: Once | INTRAVENOUS | Status: AC
  Administered 2013-01-01: 1000 mg via INTRAVENOUS
  Filled 2013-01-01: qty 100

## 2013-01-01 MED ORDER — ONDANSETRON HCL 4 MG PO TABS
4.0000 mg | ORAL_TABLET | Freq: Four times a day (QID) | ORAL | Status: DC | PRN
  Administered 2013-01-02 (×2): 4 mg via ORAL
  Filled 2013-01-01 (×2): qty 1

## 2013-01-01 MED ORDER — ONDANSETRON HCL 4 MG/2ML IJ SOLN
INTRAMUSCULAR | Status: AC
  Filled 2013-01-01: qty 2

## 2013-01-01 MED ORDER — GLYCOPYRROLATE 0.2 MG/ML IJ SOLN
INTRAMUSCULAR | Status: DC | PRN
  Administered 2013-01-01: .8 mg via INTRAVENOUS
  Administered 2013-01-01: 0.1 mg via INTRAVENOUS

## 2013-01-01 MED ORDER — PROPOFOL 10 MG/ML IV EMUL
INTRAVENOUS | Status: AC
  Filled 2013-01-01: qty 20

## 2013-01-01 MED ORDER — MIDAZOLAM HCL 2 MG/2ML IJ SOLN
INTRAMUSCULAR | Status: DC | PRN
  Administered 2013-01-01: 2 mg via INTRAVENOUS

## 2013-01-01 MED ORDER — HYDROMORPHONE HCL 2 MG PO TABS: 2.0000 mg | ORAL_TABLET | ORAL | Status: DC | PRN

## 2013-01-01 MED ORDER — ZOLPIDEM TARTRATE 5 MG PO TABS
5.0000 mg | ORAL_TABLET | Freq: Every evening | ORAL | Status: DC | PRN
  Administered 2013-01-01: 5 mg via ORAL
  Filled 2013-01-01: qty 1

## 2013-01-01 MED ORDER — SCOPOLAMINE 1 MG/3DAYS TD PT72
MEDICATED_PATCH | TRANSDERMAL | Status: AC
  Administered 2013-01-01: 1.5 mg via TRANSDERMAL
  Filled 2013-01-01: qty 1

## 2013-01-01 MED ORDER — ROCURONIUM BROMIDE 100 MG/10ML IV SOLN
INTRAVENOUS | Status: DC | PRN
  Administered 2013-01-01: 10 mg via INTRAVENOUS
  Administered 2013-01-01: 40 mg via INTRAVENOUS
  Administered 2013-01-01: 10 mg via INTRAVENOUS

## 2013-01-01 MED ORDER — MEPERIDINE HCL 25 MG/ML IJ SOLN: 6.2500 mg | INTRAMUSCULAR | Status: DC | PRN

## 2013-01-01 MED ORDER — EPHEDRINE SULFATE 50 MG/ML IJ SOLN
INTRAMUSCULAR | Status: DC | PRN
  Administered 2013-01-01: 5 mg via INTRAVENOUS

## 2013-01-01 MED ORDER — METHYLENE BLUE 1 % INJ SOLN
INTRAMUSCULAR | Status: AC
  Filled 2013-01-01: qty 10

## 2013-01-01 MED ORDER — LACTATED RINGERS IV SOLN
INTRAVENOUS | Status: DC
  Administered 2013-01-01: 50 mL/h via INTRAVENOUS
  Administered 2013-01-01: 08:00:00 via INTRAVENOUS

## 2013-01-01 MED ORDER — FENTANYL CITRATE 0.05 MG/ML IJ SOLN
INTRAMUSCULAR | Status: AC
  Filled 2013-01-01: qty 5

## 2013-01-01 MED ORDER — FENTANYL CITRATE 0.05 MG/ML IJ SOLN
25.0000 ug | INTRAMUSCULAR | Status: DC | PRN
  Administered 2013-01-01 (×2): 50 ug via INTRAVENOUS

## 2013-01-01 MED ORDER — DEXAMETHASONE SODIUM PHOSPHATE 10 MG/ML IJ SOLN
INTRAMUSCULAR | Status: DC | PRN
  Administered 2013-01-01: 5 mg via INTRAVENOUS

## 2013-01-01 MED ORDER — CEFAZOLIN SODIUM-DEXTROSE 2-3 GM-% IV SOLR
INTRAVENOUS | Status: AC
  Filled 2013-01-01: qty 50

## 2013-01-01 MED ORDER — GLYCOPYRROLATE 0.2 MG/ML IJ SOLN
INTRAMUSCULAR | Status: AC
  Filled 2013-01-01: qty 5

## 2013-01-01 MED ORDER — DEXAMETHASONE SODIUM PHOSPHATE 10 MG/ML IJ SOLN
INTRAMUSCULAR | Status: AC
  Filled 2013-01-01: qty 1

## 2013-01-01 MED ORDER — KETOROLAC TROMETHAMINE 30 MG/ML IJ SOLN
15.0000 mg | Freq: Four times a day (QID) | INTRAMUSCULAR | Status: DC
  Administered 2013-01-01 – 2013-01-02 (×4): 15 mg via INTRAVENOUS
  Filled 2013-01-01 (×3): qty 1

## 2013-01-01 MED ORDER — ONDANSETRON HCL 4 MG/2ML IJ SOLN
4.0000 mg | Freq: Four times a day (QID) | INTRAMUSCULAR | Status: DC | PRN
  Administered 2013-01-01: 4 mg via INTRAVENOUS
  Filled 2013-01-01: qty 2

## 2013-01-01 MED ORDER — LIDOCAINE HCL (CARDIAC) 20 MG/ML IV SOLN
INTRAVENOUS | Status: DC | PRN
  Administered 2013-01-01: 30 mg via INTRAVENOUS

## 2013-01-01 MED ORDER — HEPARIN SODIUM (PORCINE) 5000 UNIT/ML IJ SOLN
INTRAMUSCULAR | Status: AC
  Filled 2013-01-01: qty 1

## 2013-01-01 MED ORDER — FENTANYL CITRATE 0.05 MG/ML IJ SOLN
INTRAMUSCULAR | Status: AC
  Administered 2013-01-01: 50 ug via INTRAVENOUS
  Filled 2013-01-01: qty 2

## 2013-01-01 MED ORDER — EPHEDRINE 5 MG/ML INJ
INTRAVENOUS | Status: AC
  Filled 2013-01-01: qty 10

## 2013-01-01 MED ORDER — KCL IN DEXTROSE-NACL 20-5-0.45 MEQ/L-%-% IV SOLN
INTRAVENOUS | Status: DC
  Administered 2013-01-01 – 2013-01-02 (×2): via INTRAVENOUS
  Filled 2013-01-01 (×7): qty 1000

## 2013-01-01 MED ORDER — ONDANSETRON HCL 4 MG/2ML IJ SOLN
INTRAMUSCULAR | Status: DC | PRN
  Administered 2013-01-01: 4 mg via INTRAVENOUS

## 2013-01-01 MED ORDER — LIDOCAINE HCL (CARDIAC) 20 MG/ML IV SOLN
INTRAVENOUS | Status: AC
  Filled 2013-01-01: qty 5

## 2013-01-01 MED ORDER — NEOSTIGMINE METHYLSULFATE 1 MG/ML IJ SOLN
INTRAMUSCULAR | Status: AC
  Filled 2013-01-01: qty 1

## 2013-01-01 MED ORDER — BUPIVACAINE HCL (PF) 0.25 % IJ SOLN
INTRAMUSCULAR | Status: DC | PRN
  Administered 2013-01-01: 10 mL

## 2013-01-01 MED ORDER — LACTATED RINGERS IR SOLN
Status: DC | PRN
  Administered 2013-01-01: 3000 mL

## 2013-01-01 MED ORDER — SCOPOLAMINE 1 MG/3DAYS TD PT72
1.0000 | MEDICATED_PATCH | Freq: Once | TRANSDERMAL | Status: DC | PRN
  Administered 2013-01-01: 1.5 mg via TRANSDERMAL

## 2013-01-01 MED ORDER — ARTIFICIAL TEARS OP OINT
TOPICAL_OINTMENT | OPHTHALMIC | Status: AC
  Filled 2013-01-01: qty 3.5

## 2013-01-01 MED ORDER — FENTANYL CITRATE 0.05 MG/ML IJ SOLN
INTRAMUSCULAR | Status: DC | PRN
  Administered 2013-01-01 (×2): 50 ug via INTRAVENOUS
  Administered 2013-01-01: 25 ug via INTRAVENOUS
  Administered 2013-01-01: 50 ug via INTRAVENOUS
  Administered 2013-01-01: 25 ug via INTRAVENOUS
  Administered 2013-01-01 (×2): 50 ug via INTRAVENOUS
  Administered 2013-01-01: 25 ug via INTRAVENOUS
  Administered 2013-01-01: 100 ug via INTRAVENOUS
  Administered 2013-01-01: 25 ug via INTRAVENOUS

## 2013-01-01 MED ORDER — TRAMADOL HCL 50 MG PO TABS
100.0000 mg | ORAL_TABLET | Freq: Four times a day (QID) | ORAL | Status: DC
  Administered 2013-01-01 – 2013-01-02 (×4): 100 mg via ORAL
  Filled 2013-01-01 (×5): qty 2

## 2013-01-01 MED ORDER — NEOSTIGMINE METHYLSULFATE 1 MG/ML IJ SOLN
INTRAMUSCULAR | Status: DC | PRN
  Administered 2013-01-01: 5 mg via INTRAVENOUS

## 2013-01-01 SURGICAL SUPPLY — 72 items
APL SKNCLS STERI-STRIP NONHPOA (GAUZE/BANDAGES/DRESSINGS) ×2
BAG SPEC RTRVL LRG 6X4 10 (ENDOMECHANICALS)
BARRIER ADHS 3X4 INTERCEED (GAUZE/BANDAGES/DRESSINGS) ×3 IMPLANT
BENZOIN TINCTURE PRP APPL 2/3 (GAUZE/BANDAGES/DRESSINGS) ×3 IMPLANT
BRR ADH 4X3 ABS CNTRL BYND (GAUZE/BANDAGES/DRESSINGS) ×2
CABLE HIGH FREQUENCY MONO STRZ (ELECTRODE) IMPLANT
CATH ROBINSON RED A/P 16FR (CATHETERS) ×3 IMPLANT
CHLORAPREP W/TINT 26ML (MISCELLANEOUS) ×3 IMPLANT
CLOTH BEACON ORANGE TIMEOUT ST (SAFETY) ×3 IMPLANT
CONT PATH 16OZ SNAP LID 3702 (MISCELLANEOUS) ×3 IMPLANT
CONT SPECI 4OZ STER CLIK (MISCELLANEOUS) IMPLANT
CORDS BIPOLAR (ELECTRODE) ×3 IMPLANT
COVER MAYO STAND STRL (DRAPES) ×3 IMPLANT
COVER TABLE BACK 60X90 (DRAPES) ×6 IMPLANT
COVER TIP SHEARS 8 DVNC (MISCELLANEOUS) ×2 IMPLANT
COVER TIP SHEARS 8MM DA VINCI (MISCELLANEOUS) ×1
DECANTER SPIKE VIAL GLASS SM (MISCELLANEOUS) ×3 IMPLANT
DRAPE HUG U DISPOSABLE (DRAPE) ×3 IMPLANT
DRAPE LG THREE QUARTER DISP (DRAPES) ×6 IMPLANT
DRAPE WARM FLUID 44X44 (DRAPE) ×3 IMPLANT
ELECT REM PT RETURN 9FT ADLT (ELECTROSURGICAL) ×3
ELECTRODE REM PT RTRN 9FT ADLT (ELECTROSURGICAL) ×2 IMPLANT
EVACUATOR SMOKE 8.L (FILTER) ×3 IMPLANT
GAUZE VASELINE 3X9 (GAUZE/BANDAGES/DRESSINGS) IMPLANT
GLOVE BIO SURGEON STRL SZ 6.5 (GLOVE) ×6 IMPLANT
GLOVE BIO SURGEON STRL SZ8 (GLOVE) ×6 IMPLANT
GLOVE BIOGEL PI IND STRL 7.0 (GLOVE) ×4 IMPLANT
GLOVE BIOGEL PI INDICATOR 7.0 (GLOVE) ×2
GLOVE ECLIPSE 6.5 STRL STRAW (GLOVE) ×12 IMPLANT
GOWN PREVENTION PLUS LG XLONG (DISPOSABLE) ×6 IMPLANT
GOWN STRL REIN XL XLG (GOWN DISPOSABLE) ×18 IMPLANT
LEGGING LITHOTOMY PAIR STRL (DRAPES) ×3 IMPLANT
MANIPULATOR UTERINE 4.5 ZUMI (MISCELLANEOUS) ×3 IMPLANT
NEEDLE INSUFFLATION 120MM (ENDOMECHANICALS) ×3 IMPLANT
NS IRRIG 1000ML POUR BTL (IV SOLUTION) ×3 IMPLANT
OCCLUDER COLPOPNEUMO (BALLOONS) IMPLANT
PACK LAPAROSCOPY BASIN (CUSTOM PROCEDURE TRAY) ×3 IMPLANT
PACK LAVH (CUSTOM PROCEDURE TRAY) ×3 IMPLANT
PAD PREP 24X48 CUFFED NSTRL (MISCELLANEOUS) ×6 IMPLANT
POUCH SPECIMEN RETRIEVAL 10MM (ENDOMECHANICALS) IMPLANT
PROTECTOR NERVE ULNAR (MISCELLANEOUS) ×6 IMPLANT
SCRUB PCMX 4 OZ (MISCELLANEOUS) ×3 IMPLANT
SEALER TISSUE G2 CVD JAW 35 (ENDOMECHANICALS) IMPLANT
SEALER TISSUE G2 CVD JAW 45CM (ENDOMECHANICALS)
SET CYSTO W/LG BORE CLAMP LF (SET/KITS/TRAYS/PACK) IMPLANT
SET IRRIG TUBING LAPAROSCOPIC (IRRIGATION / IRRIGATOR) ×3 IMPLANT
SOLUTION ELECTROLUBE (MISCELLANEOUS) ×3 IMPLANT
STRIP CLOSURE SKIN 1/4X4 (GAUZE/BANDAGES/DRESSINGS) ×3 IMPLANT
SUT MNCRL AB 4-0 PS2 18 (SUTURE) ×2 IMPLANT
SUT VIC AB 0 CT1 27 (SUTURE)
SUT VIC AB 0 CT1 27XBRD ANTBC (SUTURE) IMPLANT
SUT VICRYL 0 UR6 27IN ABS (SUTURE) ×3 IMPLANT
SUT VICRYL 4-0 PS2 18IN ABS (SUTURE) IMPLANT
SYR 50ML LL SCALE MARK (SYRINGE) ×3 IMPLANT
SYSTEM CONVERTIBLE TROCAR (TROCAR) IMPLANT
TIP RUMI ORANGE 6.7MMX12CM (TIP) IMPLANT
TIP UTERINE 5.1X6CM LAV DISP (MISCELLANEOUS) IMPLANT
TIP UTERINE 6.7X10CM GRN DISP (MISCELLANEOUS) IMPLANT
TIP UTERINE 6.7X6CM WHT DISP (MISCELLANEOUS) IMPLANT
TIP UTERINE 6.7X8CM BLUE DISP (MISCELLANEOUS) IMPLANT
TOWEL OR 17X24 6PK STRL BLUE (TOWEL DISPOSABLE) ×9 IMPLANT
TRAY FOLEY CATH 14FR (SET/KITS/TRAYS/PACK) ×3 IMPLANT
TROCAR 12M 150ML BLUNT (TROCAR) IMPLANT
TROCAR DILATING TIP 12MM 150MM (ENDOMECHANICALS) ×3 IMPLANT
TROCAR DISP BLADELESS 8 DVNC (TROCAR) IMPLANT
TROCAR DISP BLADELESS 8MM (TROCAR)
TROCAR XCEL 12X100 BLDLESS (ENDOMECHANICALS) ×3 IMPLANT
TROCAR XCEL NON-BLD 11X100MML (ENDOMECHANICALS) ×4 IMPLANT
TROCAR XCEL NON-BLD 5MMX100MML (ENDOMECHANICALS) ×6 IMPLANT
TUBING FILTER THERMOFLATOR (ELECTROSURGICAL) ×3 IMPLANT
WARMER LAPAROSCOPE (MISCELLANEOUS) ×3 IMPLANT
WATER STERILE IRR 1000ML POUR (IV SOLUTION) ×9 IMPLANT

## 2013-01-01 NOTE — Interval H&P Note (Signed)
History and Physical Interval Note:  01/01/2013 7:24 AM  Colleen [name]  has presented today for surgery, with the diagnosis of uterine fibroids  The various methods of treatment have been discussed with the patient and family. After consideration of risks, benefits and other options for treatment, the patient has consented to  Procedure(s): ROBOTIC ASSISTED TOTAL HYSTERECTOMY (N/A) LAPAROSCOPIC BILATERAL SALPINGECTOMY (Bilateral) as a surgical intervention .  The patient's history has been reviewed, patient examined, no change in status, stable for surgery.  I have reviewed the patient's chart and labs.  Questions were answered to the patient's satisfaction.     JACKSON-MOORE,Christopher Glasscock A

## 2013-01-01 NOTE — Op Note (Signed)
Pre-operative Diagnosis: abnormal uterine bleeding and fibroids  Post-operative Diagnosis: same  Operation: Robotic-assisted hysterectomy with bilateral salpingectomy  Surgeon: Roseanna Rainbow  Assistant: Coral Ceo, MD  Anesthesia: GET  Urine Output: per Anesthesiology  Findings: Uterus with fibroid involvement.  Pedunculate 4 cm myoma arising from the right fundus.  Filshie clip on the right tube.  Right ovary normal.  Left adnexa absent.  Staple line along left pelvic sidewall.  Estimated Blood Loss:  less than 50 mL                 Total IV Fluids: per Anesthesiology         Specimens: PATHOLOGY               Complications:  None; patient tolerated the procedure well.         Disposition: PACU - hemodynamically stable.         Condition: Stable    Procedure Details  The patient was seen in the Holding Room. The risks, benefits, complications, treatment options, and expected outcomes were discussed with the patient.  The patient concurred with the proposed plan, giving informed consent.  The site of surgery properly noted/marked. The patient was identified as Colleen [name] and the procedure verified as a Robotic-assisted hysterectomy with uniliateral salpingectomy. A Time Out was held and the above information confirmed.  After induction of anesthesia, the patient was draped and prepped in the usual sterile manner. Pt was placed in supine position after anesthesia and draped and prepped in the usual sterile manner. The abdominal drape was placed after the CholoraPrep had been allowed to dry for 3 minutes.  Her arms were tucked to her side with all appropriate precautions.  The shoulder blocks were placed in the usual fashion.  The patient was placed in the semi-lithotomy position in [place] stirrups.  The perineum was prepped with Hibiclens.  Foley catheter was placed.  A sterile speculum was placed in the vagina.  The cervix was grasped with a single-tooth  tenaculum and dilated with Shawnie Pons dilators.  The ZUMI uterine manipulator with a medium colpotomizer ring was placed without difficulty.  A pneum occluder balloon was placed over the manipulator.  A second time-out was performed.  OG tube placement was confirmed and to suction.  Approximately 2 cm below the costal margin, in the midclavicular line the skin was anesthestized with 0.25% Marcaine.  A 5 mm incision was made and using a 5 mm Optiview, a 5 mm trocar was placed under direct vision.  The patient's abdomen was insufflated with CO2 gas.  At this point and all points during the procedure, the patient's intra-abdominal pressure did not exceed 15 mmHg.  A 10-12 camera port was place 24 cm above the pubic symphysis.  Bilateral 8 mm ports were place 10 cm and 15 degrees inferior.  All ports were placed under direct visualization.  The 5 mm port was removed.  The incision was extended to accommodate a 10 mm trocar.  A 10 mm trocar and sleeve were advanced under direct visualization.   The robot was docked in the usual fashion.  The round ligament on the right was transected with monopolar cautery.  The anterior and posterior leaves of the broad ligament were opened.  The ureter was identified.  A window was made between the infundibulopelvic ligament and the ureter.  The right utero-ovarian ligament and proximal fallopian tube were was coagulated with bipolar cautery and transected.  The uterine vessels were skeletonized to  the level of the Koh ring. The uterine vessels were coagulated with bipolar cautery and transected.   A C-loop was created in the usual fashion.  A similar procedure was performed on the patient's left side. The round ligament on the left was transected with monopolar cautery.  The anterior and posterior leaves of the broad ligament were opened.  The ureter was identified.  A window was made between the infundibulopelvic ligament and the ureter.  The left infundibulo-pelvic ligament was was  coagulated with bipolar cautery and transected.  The uterine vessels were skeletonized to the level of the Koh ring. The uterine vessels were coagulated with bipolar cautery and transected.  A C-loop was created in the usual fashion.   The bladder flap was completed.  At this time, the pneum occluder balloon was insufflated and a colpotomy was performed.  The uterus and cervix were delivered through the vagina.  The right mesosalpinx was divided with monopolar cautery and the fallopian tube was removed from the abdomen.  All pedicles were inspected under low intraabdominal pressures and noted to be hemostatic.  The vagina cuff was closed using 0-Vicryl on a CT-1 needle in a running fashion.  The abdomen and pelvis were copiously irrigated.  The needle was removed without difficulty.  The instruments were then removed under direct visualization and the robotic ports removed.  The robot was undocked.  Deep, subcutaneous, figure-of-eight 0-Vicryl sutures on a UR-6 needle were placed in the 10-12 mm supraumbilical and infracostal incisions.  All skin incisions were closed in a subcuticular fashion using 3-0 Monocryl.  Steri-strips and Benzoin were applied.  The vagina was swabbed with minimal bleeding noted.   All instrument and needle counts were correct x  2.

## 2013-01-01 NOTE — Progress Notes (Signed)
UR completed 

## 2013-01-01 NOTE — Anesthesia Postprocedure Evaluation (Signed)
  Anesthesia Post-op Note  Patient: Colleen [name]  Procedure(s) Performed: Procedure(s): ROBOTIC ASSISTED TOTAL HYSTERECTOMY (N/A) LAPAROSCOPIC RIGHT SALPINGECTOMY (Right)  Patient Location: PACU  Anesthesia Type:General  Level of Consciousness: awake, alert  and oriented  Airway and Oxygen Therapy: Patient Spontanous Breathing  Post-op Pain: mild  Post-op Assessment: Post-op Vital signs reviewed, Patient's Cardiovascular Status Stable, Respiratory Function Stable, Patent Airway, No signs of Nausea or vomiting and Pain level controlled  Post-op Vital Signs: Reviewed and stable  Complications: No apparent anesthesia complications

## 2013-01-01 NOTE — Anesthesia Preprocedure Evaluation (Signed)
Anesthesia Evaluation  Patient identified by MRN, date of birth, ID band Patient awake    Reviewed: Allergy & Precautions, H&P , NPO status , Patient's Chart, lab work & pertinent test results, reviewed documented beta blocker date and time   History of Anesthesia Complications (+) PONV and history of anesthetic complications (h/o severe PONV)  Airway Mallampati: II TM Distance: >3 FB Neck ROM: full    Dental  (+) Teeth Intact   Pulmonary former smoker (quit 15 years ago),  allergies breath sounds clear to auscultation  Pulmonary exam normal       Cardiovascular negative cardio ROS  Rhythm:regular Rate:Normal     Neuro/Psych negative neurological ROS  negative psych ROS   GI/Hepatic negative GI ROS, Neg liver ROS,   Endo/Other  negative endocrine ROS  Renal/GU negative Renal ROS  Female GU complaint     Musculoskeletal   Abdominal   Peds  Hematology negative hematology ROS (+)   Anesthesia Other Findings   Reproductive/Obstetrics negative OB ROS                           Anesthesia Physical Anesthesia Plan  ASA: II  Anesthesia Plan: General ETT   Post-op Pain Management:    Induction:   Airway Management Planned:   Additional Equipment:   Intra-op Plan:   Post-operative Plan:   Informed Consent: I have reviewed the patients History and Physical, chart, labs and discussed the procedure including the risks, benefits and alternatives for the proposed anesthesia with the patient or authorized representative who has indicated his/her understanding and acceptance.   Dental Advisory Given  Plan Discussed with: CRNA and Surgeon  Anesthesia Plan Comments:         Anesthesia Quick Evaluation

## 2013-01-01 NOTE — Transfer of Care (Signed)
Immediate Anesthesia Transfer of Care Note  Patient: Colleen [name]  Procedure(s) Performed: Procedure(s): ROBOTIC ASSISTED TOTAL HYSTERECTOMY (N/A) LAPAROSCOPIC RIGHT SALPINGECTOMY (Right)  Patient Location: PACU  Anesthesia Type:General  Level of Consciousness: awake and alert   Airway & Oxygen Therapy: Patient Spontanous Breathing and Patient connected to nasal cannula oxygen  Post-op Assessment: Report given to PACU RN and Post -op Vital signs reviewed and stable  Post vital signs: Reviewed and stable  Complications: No apparent anesthesia complications

## 2013-01-02 LAB — CBC
HCT: 29.6 % — ABNORMAL LOW (ref 36.0–46.0)
Hemoglobin: 10.4 g/dL — ABNORMAL LOW (ref 12.0–15.0)
RBC: 3.3 MIL/uL — ABNORMAL LOW (ref 3.87–5.11)
WBC: 6.4 10*3/uL (ref 4.0–10.5)

## 2013-01-02 LAB — BASIC METABOLIC PANEL
BUN: 5 mg/dL — ABNORMAL LOW (ref 6–23)
Chloride: 97 mEq/L (ref 96–112)
GFR calc Af Amer: >90 mL/min (ref >90)
GFR calc non Af Amer: >90 mL/min (ref >90)
Potassium: 4.2 mEq/L (ref 3.5–5.1)
Sodium: 130 mEq/L — ABNORMAL LOW (ref 135–145)

## 2013-01-02 MED ORDER — MECLIZINE HCL 25 MG PO TABS
50.0000 mg | ORAL_TABLET | Freq: Two times a day (BID) | ORAL | Status: DC | PRN
  Administered 2013-01-02: 50 mg via ORAL
  Filled 2013-01-02: qty 2

## 2013-01-02 MED ORDER — IBUPROFEN 800 MG PO TABS
800.0000 mg | ORAL_TABLET | Freq: Three times a day (TID) | ORAL | Status: DC | PRN
  Administered 2013-01-03: 800 mg via ORAL
  Filled 2013-01-02: qty 1

## 2013-01-02 MED ORDER — TRAMADOL HCL 50 MG PO TABS: 100.0000 mg | ORAL_TABLET | Freq: Four times a day (QID) | ORAL | Status: DC | PRN

## 2013-01-02 MED ORDER — IBUPROFEN 800 MG PO TABS: 800.0000 mg | ORAL_TABLET | Freq: Three times a day (TID) | ORAL | Status: AC | PRN

## 2013-01-02 MED ORDER — HYDROMORPHONE HCL 2 MG PO TABS: 2.0000 mg | ORAL_TABLET | ORAL | Status: DC | PRN

## 2013-01-02 NOTE — Anesthesia Postprocedure Evaluation (Signed)
  Anesthesia Post-op Note  Patient: Colleen [name]  Procedure(s) Performed: Procedure(s): ROBOTIC ASSISTED TOTAL HYSTERECTOMY (N/A) LAPAROSCOPIC RIGHT SALPINGECTOMY (Right)  Patient Location: [facility] Unit  Anesthesia Type:General  Level of Consciousness: awake, alert  and oriented  Airway and Oxygen Therapy: Patient Spontanous Breathing  Post-op Pain: mild  Post-op Assessment: Patient's Cardiovascular Status Stable, Respiratory Function Stable, Patent Airway, No signs of Nausea or vomiting and Pain level controlled  Post-op Vital Signs: stable  Complications: No apparent anesthesia complications

## 2013-01-02 NOTE — Discharge Summary (Signed)
Physician Discharge Summary  Patient ID: Colleen [name] MRN: [id number] DOB/AGE: February 03, 1955 57 y.o.  Admit date: 01/01/2013 Discharge date: 01/02/2013  Admission Diagnoses:  Symptomatic uterine fibroids.  Discharge Diagnoses:  Same.  S/P Robotic Assisted TLH and Bilateral Salpingectomy. Active Problems:   Leiomyoma of uterus, unspecified   Discharged Condition: good  Hospital Course: Patient underwent Robotic Assisted TLH and Bilateral Salpingectomy without complications.  Post op course uncomplicated.  Discharged home in good condition.  Consults: None  Significant Diagnostic Studies: labs: CBC, CMET.  Treatments: IV hydration, analgesia: Toradol and Ultram and surgery: Robotic Assisted TLH and BS.  Discharge Exam: Blood pressure 117/54, pulse 73, temperature 97.8 F (36.6 C), temperature source Oral, resp. rate 16, height 5\' 4"  (1.626 m), weight 160 lb (72.576 kg), last menstrual period 12/04/2012, SpO2 100.00%. General appearance: alert and no distress Resp: clear to auscultation bilaterally Cardio: regular rate and rhythm, S1, S2 normal, no murmur, click, rub or gallop GI: normal findings: soft, non-tender Extremities: extremities normal, atraumatic, no cyanosis or edema  Disposition: 01-Home or Self Care  Discharge Orders   Future Orders Complete By Expires   Diet - low sodium heart healthy  As directed    Discharge instructions  As directed    Comments:     Routine   Driving Restrictions  As directed    Comments:     No driving for 2 weeks.   Increase activity slowly  As directed    Lifting restrictions  As directed    Comments:     No lifting > 10 lbs.   Sexual Activity Restrictions  As directed    Comments:     No sex for 8 weeks.       Medication List    STOP taking these medications       progesterone 200 MG capsule  Commonly known as:  PROMETRIUM      TAKE these medications       CALCIUM 500 +D 500-400 MG-UNIT Tabs  Generic drug:  Calcium  Carb-Cholecalciferol  Take 2 tablets by mouth daily.     estradiol 2 MG tablet  Commonly known as:  ESTRACE  Take 2 mg by mouth daily.     fluticasone 50 MCG/ACT nasal spray  Commonly known as:  FLONASE  Place 2 sprays into the nose at bedtime.     HAIR/SKIN/NAILS PO  Take 2 tablets by mouth daily. Contains Vit C and Biotin     HYDROmorphone 2 MG tablet  Commonly known as:  DILAUDID  Take 1 tablet (2 mg total) by mouth every 4 (four) hours as needed for severe pain.     ibuprofen 800 MG tablet  Commonly known as:  ADVIL,MOTRIN  Take 1 tablet (800 mg total) by mouth every 8 (eight) hours as needed.     loratadine 10 MG tablet  Commonly known as:  CLARITIN  Take 10 mg by mouth daily as needed for allergies.     metroNIDAZOLE 500 MG tablet  Commonly known as:  FLAGYL  Take 1 tablet (500 mg total) by mouth 2 (two) times daily.     TESTOSTERONE TD  Place 1 application onto the skin daily. Cream compounded at [facility]     traMADol 50 MG tablet  Commonly known as:  ULTRAM  Take 2 tablets (100 mg total) by mouth every 6 (six) hours as needed for moderate pain.           Follow-up Information   Follow up  with Roseanna Rainbow, MD. Schedule an appointment as soon as possible for a visit in 1 week.   Specialty:  Obstetrics and Gynecology   Contact information:   [address] [place] [address] [phone]       Signed: Brock Bad 01/02/2013, 6:28 AM

## 2013-01-02 NOTE — CV Procedure (Signed)
Patient report persistent dizziness and nausea. Pt denies any side effects from previous pain medication.  Vital signs WNL. MD notified of patient change in status.  New orders placed in section.  Antivert and Zofran administered.  Dark room and cool washcloth provided.  Will continue to monitor.  Osvaldo Angst, RN------------

## 2013-01-02 NOTE — Progress Notes (Signed)
Subjective: Patient reports tolerating PO, + flatus and no problems voiding.    Objective: I have reviewed patient's vital signs, intake and output, medications and labs.  General: alert and no distress Resp: clear to auscultation bilaterally Cardio: regular rate and rhythm, S1, S2 normal, no murmur, click, rub or gallop GI: normal findings: soft, non-tender Extremities: extremities normal, atraumatic, no cyanosis or edema Vaginal Bleeding: none   Assessment/Plan: Doing well.  Probable discharge home later today.   LOS: 1 day    HARPER,CHARLES A 01/02/2013, 6:15 AM

## 2013-01-03 MED ORDER — ZOLPIDEM TARTRATE 5 MG PO TABS: 5.0000 mg | ORAL_TABLET | Freq: Every evening | ORAL | Status: DC | PRN

## 2013-01-03 NOTE — Progress Notes (Signed)
Discharge instructions provided to patient and significant other at bedside.  Medications, activity, incision care, when to call the doctor, follow up appointments and community resources discussed.  No questions at this time.  Patient left unit in stable condition with all personal belongings and prescriptions accompanied by staff.  Osvaldo Angst, RN--------------

## 2013-01-03 NOTE — Progress Notes (Signed)
2 Days Post-Op Procedure(s) (LRB): ROBOTIC ASSISTED TOTAL HYSTERECTOMY (N/A) LAPAROSCOPIC RIGHT SALPINGECTOMY (Right)  Subjective: Patient reports tolerating PO, + flatus and no problems voiding.    Objective: I have reviewed patient's vital signs, intake and output, medications and labs.  General: alert and no distress Resp: clear to auscultation bilaterally Cardio: regular rate and rhythm, S1, S2 normal, no murmur, click, rub or gallop GI: normal findings: soft, non-tender and incision: clean, dry and intact Extremities: extremities normal, atraumatic, no cyanosis or edema Vaginal Bleeding: none  Assessment: s/p Procedure(s): ROBOTIC ASSISTED TOTAL HYSTERECTOMY (N/A) LAPAROSCOPIC RIGHT SALPINGECTOMY (Right): stable, progressing well and tolerating diet  Plan: Discharge home  LOS: 2 days    Colleen [name] A 01/03/2013, 5:34 AM

## 2013-01-04 ENCOUNTER — Encounter (HOSPITAL_COMMUNITY): Payer: Self-pay | Admitting: Obstetrics & Gynecology

## 2013-01-13 ENCOUNTER — Encounter: Payer: Self-pay | Admitting: Obstetrics & Gynecology

## 2013-01-13 ENCOUNTER — Ambulatory Visit (INDEPENDENT_AMBULATORY_CARE_PROVIDER_SITE_OTHER): Payer: BC Managed Care – PPO | Admitting: Obstetrics & Gynecology

## 2013-01-13 VITALS — BP 123/79 | HR 79 | Temp 97.8°F | Ht 64.0 in | Wt 163.0 lb

## 2013-01-13 DIAGNOSIS — N951 Menopausal and female climacteric states: Secondary | ICD-10-CM

## 2013-01-13 DIAGNOSIS — R350 Frequency of micturition: Secondary | ICD-10-CM

## 2013-01-13 LAB — POCT URINALYSIS DIPSTICK
Bilirubin, UA: NEGATIVE
Blood, UA: NEGATIVE
Glucose, UA: NEGATIVE
Leukocytes, UA: NEGATIVE
Spec Grav, UA: 1.015
Urobilinogen, UA: NEGATIVE
pH, UA: 6

## 2013-01-13 MED ORDER — ESTRADIOL 0.25 MG/0.25GM TD GEL: 0.2500 g | Freq: Every day | TRANSDERMAL | Status: DC

## 2013-01-13 MED ORDER — ZOSTER VACCINE LIVE 19400 UNT/0.65ML ~~LOC~~ SOLR: 0.6500 mL | Freq: Once | SUBCUTANEOUS | Status: DC

## 2013-01-13 NOTE — Progress Notes (Signed)
Subjective:     Colleen [name] is a 57 y.o. female here for a routine exam.  Current complaints: denying pain , discharge, bleeding reports having constipation, having urinary frequency, thinks might be getting bladder infection.  Personal health questionnaire reviewed: yes.   Gynecologic History Patient's last menstrual period was 12/04/2012. Contraception: status post hysterectomy Last Pap 2014. Results were: normal Last mammogram: 11/15/2012. Results were: normal  Obstetric History OB History  No data available     The following portions of the patient's history were reviewed and updated as appropriate: allergies, current medications, past family history, past medical history, past social history, past surgical history and problem list.  Review of Systems Pertinent items are noted in HPI.    Objective:     Abd: incisions well-healed     Assessment:    Doing well postoperatively  Plan:     Divigel  Return in 4 weeks

## 2013-01-14 LAB — URINALYSIS
Bilirubin Urine: NEGATIVE
Glucose, UA: NEGATIVE mg/dL
Hgb urine dipstick: NEGATIVE
Ketones, ur: NEGATIVE mg/dL
Leukocytes, UA: NEGATIVE
Nitrite: NEGATIVE
Protein, ur: NEGATIVE mg/dL
Specific Gravity, Urine: 1.017 (ref 1.005–1.030)
Urobilinogen, UA: 0.2 mg/dL (ref 0.0–1.0)
pH: 7 (ref 5.0–8.0)

## 2013-01-15 DIAGNOSIS — N951 Menopausal and female climacteric states: Secondary | ICD-10-CM | POA: Insufficient documentation

## 2013-01-15 NOTE — Patient Instructions (Signed)

## 2013-01-16 LAB — URINE CULTURE
Colony Count: NO GROWTH
Organism ID, Bacteria: NO GROWTH

## 2013-02-24 ENCOUNTER — Ambulatory Visit (INDEPENDENT_AMBULATORY_CARE_PROVIDER_SITE_OTHER): Payer: BC Managed Care – PPO | Admitting: Obstetrics & Gynecology

## 2013-02-24 ENCOUNTER — Encounter: Payer: Self-pay | Admitting: Obstetrics & Gynecology

## 2013-02-24 VITALS — BP 126/86 | HR 68 | Temp 98.0°F | Wt 162.0 lb

## 2013-02-24 DIAGNOSIS — R319 Hematuria, unspecified: Secondary | ICD-10-CM

## 2013-02-24 DIAGNOSIS — Z09 Encounter for follow-up examination after completed treatment for conditions other than malignant neoplasm: Secondary | ICD-10-CM

## 2013-02-24 LAB — POCT URINALYSIS DIPSTICK
BILIRUBIN UA: NEGATIVE
Blood, UA: NEGATIVE
Glucose, UA: NEGATIVE
Ketones, UA: NEGATIVE
NITRITE UA: NEGATIVE
Spec Grav, UA: 1.025
Urobilinogen, UA: NEGATIVE
pH, UA: 5

## 2013-02-24 MED ORDER — CLINDAMYCIN HCL 300 MG PO CAPS: 300.0000 mg | ORAL_CAPSULE | Freq: Two times a day (BID) | ORAL | Status: AC

## 2013-02-24 MED ORDER — ESTRADIOL 1 MG/GM TD GEL: 1.0000 mg | Freq: Every day | TRANSDERMAL | Status: AC

## 2013-02-24 NOTE — Progress Notes (Signed)
Subjective:     Colleen [name] is a 58 y.o. female here for a follow up exam.  Current complaints: pt states that she is still having hot flashes with use of Divigel. Pt states that she may also have a bacterial infection and would like to use Metrogel. Pt states that she is having bladder concerns again.  Pt was previously on Detrol use good results.  Pt states that she is doing well post surgery.    Personal health questionnaire reviewed: yes.   Gynecologic History Patient's last menstrual period was 12/04/2012. Contraception: status post hysterectomy Last Pap: 2014. Results were: normal   Obstetric History OB History  No data available     The following portions of the patient's history were reviewed and updated as appropriate: allergies, current medications, past family history, past medical history, past social history, past surgical history and problem list.  Review of Systems Pertinent items are noted in HPI.  Objective:     SPEC: vaginal mucosa--separation, superficial Small heme.  Written consent obtained for repair of vaginal mucosa.  The vaginal cuff was prepped with betadine.  The mucosa was infiltrated with 1% lidocaine.  Interrupted sutures of 3 - 0 Vicryl were placed in the vaginal mucosa.    Assessment:    S/P revisionn of the vaginal mucosa   Menopausal symptoms ?vaginitis Plan:  Return in 1 mth

## 2013-02-25 LAB — URINALYSIS, ROUTINE W REFLEX MICROSCOPIC
Bilirubin Urine: NEGATIVE
Glucose, UA: NEGATIVE mg/dL
Ketones, ur: NEGATIVE mg/dL
NITRITE: NEGATIVE
PH: 5.5 (ref 5.0–8.0)
Protein, ur: NEGATIVE mg/dL
SPECIFIC GRAVITY, URINE: 1.021 (ref 1.005–1.030)
UROBILINOGEN UA: 0.2 mg/dL (ref 0.0–1.0)

## 2013-02-25 LAB — URINALYSIS, MICROSCOPIC ONLY
Bacteria, UA: NONE SEEN
CASTS: NONE SEEN

## 2013-02-26 LAB — URINE CULTURE

## 2013-03-11 ENCOUNTER — Ambulatory Visit (INDEPENDENT_AMBULATORY_CARE_PROVIDER_SITE_OTHER): Payer: BC Managed Care – PPO | Admitting: Obstetrics & Gynecology

## 2013-03-11 ENCOUNTER — Encounter: Payer: Self-pay | Admitting: Obstetrics & Gynecology

## 2013-03-11 DIAGNOSIS — Z09 Encounter for follow-up examination after completed treatment for conditions other than malignant neoplasm: Secondary | ICD-10-CM

## 2013-03-11 NOTE — Progress Notes (Signed)
Subjective:     Colleen [name] is a 58 y.o. female here for a f/u exam.  Current complaints: none.    The following portions of the patient's history were reviewed and updated as appropriate: allergies, current medications, past family history, past medical history, past social history, past surgical history and problem list.  Review of Systems Pertinent items are noted in HPI.    Objective:    SPEC:  Moderate yellow discharge; cuff intact Bimanual:  Sutures palpable, cuff NT  Assessment:    Vaginal cuff mucosa healing well/intact   Plan:    Follow up in: 1 month. Resume intercourse in 2 weeks

## 2013-04-15 ENCOUNTER — Ambulatory Visit: Payer: BC Managed Care – PPO | Admitting: Obstetrics & Gynecology

## 2013-05-06 ENCOUNTER — Encounter: Payer: Self-pay | Admitting: Obstetrics & Gynecology

## 2013-05-06 ENCOUNTER — Ambulatory Visit (INDEPENDENT_AMBULATORY_CARE_PROVIDER_SITE_OTHER): Payer: 59 | Admitting: Obstetrics & Gynecology

## 2013-05-06 VITALS — BP 150/84 | HR 56 | Temp 97.7°F | Ht 64.0 in | Wt 167.0 lb

## 2013-05-06 DIAGNOSIS — N760 Acute vaginitis: Secondary | ICD-10-CM

## 2013-05-06 NOTE — Progress Notes (Signed)
Subjective:     Colleen [name] is a 58 y.o. female who presents to the clinic 5 months status post TRH .  She c/o a malodorous vaginal discharge with irritation. The following portions of the patient's history were reviewed and updated as appropriate: allergies, current medications, past family history, past medical history, past social history, past surgical history and problem list.  Review of Systems Constitutional: negative Genitourinary:positive for hot flashes and vaginal discharge Behavioral/Psych: positive for insomnia Endocrine: positive for temperature intolerance    Objective:    BP 150/84  Pulse 56  Temp(Src) 97.7 F (36.5 C)  Ht 5\' 4"  (1.626 m)  Wt 75.751 kg (167 lb)  BMI 28.65 kg/m2  LMP 12/04/2012 General:  alert  Abdomen: soft, bowel sounds active, non-tender  Pelvic Scant, white discharge; cuff well-healed       Assessment:    Doing well postoperatively. ?Vulvovaginitis vs atrophic vaginitis   Plan:   Orders Placed This Encounter  Procedures  . WET PREP BY MOLECULAR PROBE  . POCT Wet Prep [facility])    Continue any current medications. Follow up in a few months

## 2013-05-07 LAB — WET PREP BY MOLECULAR PROBE
Candida species: NEGATIVE
GARDNERELLA VAGINALIS: NEGATIVE
Trichomonas vaginosis: NEGATIVE

## 2013-05-07 LAB — POCT WET PREP (WET MOUNT)
Clue Cells Wet Prep Whiff POC: NEGATIVE
KOH Wet Prep POC: NEGATIVE

## 2013-05-20 ENCOUNTER — Encounter: Payer: Self-pay | Admitting: Obstetrics & Gynecology

## 2013-08-05 ENCOUNTER — Ambulatory Visit: Payer: 59 | Admitting: Obstetrics & Gynecology

## 2013-08-30 ENCOUNTER — Ambulatory Visit: Payer: 59 | Admitting: Obstetrics & Gynecology

## 2013-09-02 ENCOUNTER — Ambulatory Visit: Payer: 59 | Admitting: Obstetrics & Gynecology

## 2013-09-06 ENCOUNTER — Ambulatory Visit (INDEPENDENT_AMBULATORY_CARE_PROVIDER_SITE_OTHER): Payer: 59 | Admitting: Obstetrics & Gynecology

## 2013-09-06 ENCOUNTER — Encounter: Payer: Self-pay | Admitting: Obstetrics & Gynecology

## 2013-09-06 VITALS — BP 140/80 | Temp 97.9°F | Ht 64.0 in | Wt 166.0 lb

## 2013-09-06 DIAGNOSIS — N951 Menopausal and female climacteric states: Secondary | ICD-10-CM

## 2013-09-06 DIAGNOSIS — Z780 Asymptomatic menopausal state: Secondary | ICD-10-CM

## 2013-09-06 NOTE — Progress Notes (Signed)
Patient ID: [name], female   DOB: 06-Jan-1956, 58 y.o.   MRN: [id number]  Chief Complaint  Patient presents with  . Follow-up    TRH     HPI Colleen [name] is [name] 58 y.o. female.  C/O hot flushes.  HPI  Past Medical History  Diagnosis Date  . PONV (postoperative nausea and vomiting)     severe N/V with last surgery  . Medical history non-contributory   . Insomnia   . Night sweats   . Hot flashes   . Allergy     seasonal  . Urinary frequency     Past Surgical History  Procedure Laterality Date  . Tubal ligation    . Shoulder surgery    . Endometrial ablation    . Oophorectomy    . Uterine fibroid surgery    . Robotic assisted total hysterectomy N/[name] 01/01/2013    Procedure: ROBOTIC ASSISTED TOTAL HYSTERECTOMY;  Surgeon: Colleen Crocker, MD;  Location: [facility];  Service: Gynecology;  Laterality: N/[name];  . Laparoscopic bilateral salpingectomy Right 01/01/2013    Procedure: LAPAROSCOPIC RIGHT SALPINGECTOMY;  Surgeon: Colleen Crocker, MD;  Location: [facility];  Service: Gynecology;  Laterality: Right;    Family History  Problem Relation Age of Onset  . Arthritis Mother   . Fibromyalgia Mother   . Hypertension Mother   . Autoimmune disease Father     Social History History  Substance Use Topics  . Smoking status: Former Research scientist (life sciences)  . Smokeless tobacco: Never Used  . Alcohol Use: 0.6 oz/week    1 Glasses of wine per week     Comment: occasional wine    Allergies  Allergen Reactions  . Hydrocodone-Acetaminophen Nausea And Vomiting  . Other     Unknown pain medication, dizziness, nausea  . Sulfa Antibiotics Hives  . Adhesive [Tape] Rash  . Ivp Dye [Iodinated Diagnostic Agents] Rash    Current Outpatient Prescriptions  Medication Sig Dispense Refill  . Calcium Carb-Cholecalciferol (CALCIUM 500 +D) 500-400 MG-UNIT TABS Take 2 tablets by mouth daily.      . Estradiol (DIVIGEL) 1 MG/GM GEL Place 1 mg onto the skin daily.  30 g  6  . fluticasone (FLONASE) 50  MCG/ACT nasal spray Place 2 sprays into the nose at bedtime.       Marland Kitchen ibuprofen (ADVIL,MOTRIN) 800 MG tablet Take 1 tablet (800 mg total) by mouth every 8 (eight) hours as needed.  30 tablet  5  . loratadine (CLARITIN) 10 MG tablet Take 10 mg by mouth daily as needed for allergies.      . Multiple Vitamins-Minerals (HAIR/SKIN/NAILS PO) Take 2 tablets by mouth daily. Contains Vit C and Biotin      . TESTOSTERONE TD Place 1 application onto the skin daily. Cream compounded at [place]      . zolpidem (AMBIEN) 5 MG tablet Take 1 tablet (5 mg total) by mouth at bedtime as needed for sleep.  30 tablet  2   No current facility-administered medications for this visit.    Review of Systems Review of Systems Constitutional: negative for fatigue and weight loss Respiratory: negative for cough and wheezing Cardiovascular: negative for chest pain, fatigue and palpitations Gastrointestinal: negative for abdominal pain and change in bowel habits Genitourinary:negative for abnormal vaginal discharge Integument/breast: negative for nipple discharge Musculoskeletal:negative for myalgias Neurological: negative for gait problems and tremors Behavioral/Psych: negative for abusive relationship, depression Endocrine: negative for temperature intolerance     Blood pressure 140/80, temperature 97.9  F (36.6 C), height 5\' 4"  (1.626 m), weight 75.297 kg (166 lb), last menstrual period 12/04/2012.  Physical Exam Physical Exam General:   alert  Skin:   no rash or abnormalities  Abdomen:  normal findings: no organomegaly, soft, non-tender and no hernia  Pelvis:  External genitalia: normal general appearance Urinary system: urethral meatus normal and bladder without fullness, nontender Vaginal: normal without tenderness, induration or masses; vaginal cuff NT Adnexa: normal bimanual exam      Data Reviewed None  Assessment    Hot flushes on ERT     Plan    Orders Placed This Encounter   Procedures  . TSH  . Estradiol     Possible management options include: lifestyle modifications Follow up as needed or 12/15         Colleen [name],Colleen [name] 09/06/2013, 5:16 PM

## 2013-09-07 LAB — ESTRADIOL: ESTRADIOL: 24 pg/mL

## 2013-09-07 LAB — TSH: TSH: 0.637 u[IU]/mL (ref 0.350–4.500)

## 2013-10-25 ENCOUNTER — Telehealth: Payer: Self-pay | Admitting: *Deleted

## 2013-10-25 NOTE — Telephone Encounter (Signed)
Pt called to office re: lab results and medication.  Pt states that she would like to discuss her estradiol level and know if she would be able to increase her medication.  Pt state that she is having severe hot flashes.  Return call to. Left message on voice mail for pt contact office regarding medication.

## 2013-10-27 ENCOUNTER — Other Ambulatory Visit: Payer: Self-pay | Admitting: *Deleted

## 2013-10-27 DIAGNOSIS — N951 Menopausal and female climacteric states: Secondary | ICD-10-CM

## 2013-10-27 MED ORDER — ESTROGENS CONJUGATED 1.25 MG PO TABS: 1.2500 mg | ORAL_TABLET | Freq: Every day | ORAL | Status: DC

## 2013-10-27 NOTE — Telephone Encounter (Signed)
Pt made aware of Dr Delsa Sale recommendations regarding Estrogen therapy.  Pt states that she will try Premarin 1.25mg  to see if she has change in symptoms.  Rx was sent to pharmacy.  Pt to contact office if Rx is to much of an expense.

## 2013-10-27 NOTE — Telephone Encounter (Signed)
Patient was called by the provider and her questions were answered

## 2013-10-27 NOTE — Progress Notes (Signed)
Pt called regarding her need for change in estrogen therapy.  See previous phone note

## 2013-10-28 ENCOUNTER — Encounter: Payer: Self-pay | Admitting: Neurology

## 2013-10-28 ENCOUNTER — Encounter: Payer: Self-pay | Admitting: *Deleted

## 2013-10-28 ENCOUNTER — Ambulatory Visit (INDEPENDENT_AMBULATORY_CARE_PROVIDER_SITE_OTHER): Payer: 59 | Admitting: Neurology

## 2013-10-28 VITALS — BP 129/80 | HR 69 | Ht 64.0 in | Wt 169.0 lb

## 2013-10-28 DIAGNOSIS — G43909 Migraine, unspecified, not intractable, without status migrainosus: Secondary | ICD-10-CM | POA: Insufficient documentation

## 2013-10-28 DIAGNOSIS — G43709 Chronic migraine without aura, not intractable, without status migrainosus: Secondary | ICD-10-CM

## 2013-10-28 MED ORDER — RIZATRIPTAN BENZOATE 5 MG PO TBDP: 5.0000 mg | ORAL_TABLET | ORAL | Status: AC | PRN

## 2013-10-28 MED ORDER — NORTRIPTYLINE HCL 10 MG PO CAPS: ORAL_CAPSULE | ORAL | Status: AC

## 2013-10-28 NOTE — Progress Notes (Addendum)
PATIENT: Colleen [name] DOB: 06-09-1955  HISTORICAL  Colleen [name] is a 58 year old female, referred by her primary care physician Dr. Theadore Nan for evaluation of recurrent dizziness episode.  Over the past 10 years, around 2005, she began to have this recurrent episode of dizziness, with associated headaches, all episode had a similar presentation, most recent one was in October 2015, she woke up in the morning, felt dizziness, room was spinning, nauseous, also noticed to light noise sensitivity, smell sensitivity, she also complains of mild to moderate holocranial pressure headaches, movement makes her headache worse, sleeps help her symptoms, an episode last about 2 or 3 days, gradually resolved, she was evaluated by ENT in the past, no etiology found, she was not able to identify triggers, she has episode every 3 months also, but in between episode, she also has mild pressure sensation in her head, mild dizziness nausea sensation, this has been happening couple times a week,  She denies visual change, no lateralized motor or sensory deficit in between   REVIEW OF SYSTEMS: Full 14 system review of systems performed and notable only for spinning sensation, itching, feeling hot, allergy, headaches, dizziness, insomnia and   ALLERGIES: Allergies  Allergen Reactions  . Hydrocodone-Acetaminophen Nausea And Vomiting  . Other     Unknown pain medication, dizziness, nausea  . Sulfa Antibiotics Hives  . Adhesive [Tape] Rash  . Ivp Dye [Iodinated Diagnostic Agents] Rash    HOME MEDICATIONS: Current Outpatient Prescriptions on File Prior to Visit  Medication Sig Dispense Refill  . Calcium Carb-Cholecalciferol (CALCIUM 500 +D) 500-400 MG-UNIT TABS Take 2 tablets by mouth daily.      . Estradiol (DIVIGEL) 1 MG/GM GEL Place 1 mg onto the skin daily.  30 g  6  . estrogens, conjugated, (PREMARIN) 1.25 MG tablet Take 1 tablet (1.25 mg total) by mouth daily.  30 tablet  2  . fluticasone  (FLONASE) 50 MCG/ACT nasal spray Place 2 sprays into the nose at bedtime.       Marland Kitchen ibuprofen (ADVIL,MOTRIN) 800 MG tablet Take 1 tablet (800 mg total) by mouth every 8 (eight) hours as needed.  30 tablet  5  . loratadine (CLARITIN) 10 MG tablet Take 10 mg by mouth daily as needed for allergies.      . Multiple Vitamins-Minerals (HAIR/SKIN/NAILS PO) Take 2 tablets by mouth daily. Contains Vit C and Biotin      . TESTOSTERONE TD Place 1 application onto the skin daily. Cream compounded at [place]      . zolpidem (AMBIEN) 5 MG tablet Take 1 tablet (5 mg total) by mouth at bedtime as needed for sleep.  30 tablet  2   No current facility-administered medications on file prior to visit.    PAST MEDICAL HISTORY: Past Medical History  Diagnosis Date  . PONV (postoperative nausea and vomiting)     severe N/V with last surgery  . Medical history non-contributory   . Insomnia   . Night sweats   . Hot flashes   . Allergy     seasonal  . Urinary frequency     PAST SURGICAL HISTORY: Past Surgical History  Procedure Laterality Date  . Tubal ligation    . Shoulder surgery Bilateral   . Endometrial ablation    . Oophorectomy    . Uterine fibroid surgery    . Robotic assisted total hysterectomy N/A 01/01/2013    Procedure: ROBOTIC ASSISTED TOTAL HYSTERECTOMY;  Surgeon: Lahoma Crocker, MD;  Location: [facility];  Service: Gynecology;  Laterality: N/A;  . Laparoscopic bilateral salpingectomy Right 01/01/2013    Procedure: LAPAROSCOPIC RIGHT SALPINGECTOMY;  Surgeon: Lahoma Crocker, MD;  Location: [facility];  Service: Gynecology;  Laterality: Right;    FAMILY HISTORY: Family History  Problem Relation Age of Onset  . Arthritis Mother   . Fibromyalgia Mother   . Hypertension Mother   . Autoimmune disease Father     SOCIAL HISTORY:  History   Social History  . Marital Status: Divorced    Spouse Name: N/A    Number of Children: none  . Years of Education: N/A    Occupational History  . Computer job   Social History Main Topics  . Smoking status: Former Research scientist (life sciences)  . Smokeless tobacco: Never Used  . Alcohol Use: 0.6 oz/week    1 Glasses of wine per week     Comment: occasional wine  . Drug Use: No  . Sexual Activity: Yes    Partners: Male    Birth Control/ Protection: Surgical   Other Topics Concern  . Not on file   Social History Narrative  . No narrative on file     PHYSICAL EXAM   Filed Vitals:   10/28/13 1424  BP: 129/80  Pulse: 69  Height: 5\' 4"  (1.626 m)  Weight: 169 lb (76.658 kg)    Not recorded    Body mass index is 28.99 kg/(m^2).   Generalized: In no acute distress  Neck: Supple, no carotid bruits   Cardiac: Regular rate rhythm  Pulmonary: Clear to auscultation bilaterally  Musculoskeletal: No deformity  Neurological examination  Mentation: Alert oriented to time, place, history taking, and causual conversation  Cranial nerve II-XII: Pupils were equal round reactive to light. Extraocular movements were full.  Visual field were full on confrontational test. Bilateral fundi were sharp.  Facial sensation and strength were normal. Hearing was intact to finger rubbing bilaterally. Uvula tongue midline.  Head turning and shoulder shrug and were normal and symmetric.Tongue protrusion into cheek strength was normal.  Motor: Normal tone, bulk and strength.  Sensory: Intact to fine touch, pinprick, preserved vibratory sensation, and proprioception at toes.  Coordination: Normal finger to nose, heel-to-shin bilaterally there was no truncal ataxia  Gait: Rising up from seated position without assistance, normal stance, without trunk ataxia, moderate stride, good arm swing, smooth turning, able to perform tiptoe, and heel walking without difficulty.   Romberg signs: Negative  Deep tendon reflexes: Brachioradialis 2/2, biceps 2/2, triceps 2/2, patellar 2/2, Achilles 2/2, plantar responses were flexor  bilaterally.   DIAGNOSTIC DATA (LABS, IMAGING, TESTING) - I reviewed patient records, labs, notes, testing and imaging myself where available.  Lab Results  Component Value Date   WBC 6.4 01/02/2013   HGB 10.4* 01/02/2013   HCT 29.6* 01/02/2013   MCV 89.7 01/02/2013   PLT 182 01/02/2013      Component Value Date/Time   NA 130* 01/02/2013 0505   K 4.2 01/02/2013 0505   CL 97 01/02/2013 0505   CO2 25 01/02/2013 0505   GLUCOSE 111* 01/02/2013 0505   BUN 5* 01/02/2013 0505   CREATININE 0.67 01/02/2013 0505   CALCIUM 8.0* 01/02/2013 0505   PROT 6.2 03/06/2007 0615   ALBUMIN 3.8 03/06/2007 0615   AST 19 03/06/2007 0615   ALT 20 03/06/2007 0615   ALKPHOS 35* 03/06/2007 0615   BILITOT 0.4 03/06/2007 0615   GFRNONAA >90 01/02/2013 0505   GFRAA >90 01/02/2013 0505    Lab Results  Component Value Date   TSH 0.637 09/06/2013    ASSESSMENT AND PLAN  Colleen [name] is a 58 y.o. female complains Of recurrent episode of dizziness, with associated headaches, most consistent with migraine variants, minor degree of similar episode couple times a week   1, add on preventive medications, Nortriptlyn, 10 mg, titrating to 20 mg every night  2, Maxalt as needed  3, return to clinic in 3 months     Marcial Pacas, M.D. Ph.D.  [facility] [address] [place], [place] [address] [phone]

## 2013-10-29 ENCOUNTER — Telehealth: Payer: Self-pay

## 2013-10-29 NOTE — Telephone Encounter (Signed)
Colleen [name] sent a fax saying the patient has been taking Zofran from another provider and taking that in combination with Nortriptyline could cause QT Prolongation.  They would like to know if it is kay to proceed with dispensing Nortriptyline.  Please advise.  Thank you.

## 2013-10-29 NOTE — Telephone Encounter (Signed)
Colleen [name]: I have reviewed the chart, she only taking Zofran occasionally, no cardiac history, please callback pharmacy, it is okay to start her on nortriptyline, as prescribed

## 2013-10-29 NOTE — Telephone Encounter (Signed)
Pharmacy has been notified.

## 2013-11-18 ENCOUNTER — Encounter: Payer: Self-pay | Admitting: *Deleted

## 2013-12-16 ENCOUNTER — Ambulatory Visit (INDEPENDENT_AMBULATORY_CARE_PROVIDER_SITE_OTHER): Payer: 59 | Admitting: Obstetrics & Gynecology

## 2013-12-16 VITALS — BP 153/96 | HR 75 | Wt 168.0 lb

## 2013-12-16 DIAGNOSIS — R232 Flushing: Secondary | ICD-10-CM

## 2013-12-16 DIAGNOSIS — N951 Menopausal and female climacteric states: Secondary | ICD-10-CM

## 2013-12-16 NOTE — Patient Instructions (Signed)
Bupropion tablets (Depression/Mood Disorders) What is this medicine? BUPROPION (byoo PROE pee on) is used to treat depression. This medicine may be used for other purposes; ask your health care provider or pharmacist if you have questions. COMMON BRAND NAME(S): Wellbutrin What should I tell my health care provider before I take this medicine? They need to know if you have any of these conditions: -an eating disorder, such as anorexia or bulimia -bipolar disorder or psychosis -diabetes or high blood sugar, treated with medication -glaucoma -heart disease, previous heart attack, or irregular heart beat -head injury or brain tumor -high blood pressure -kidney or liver disease -seizures -suicidal thoughts or a previous suicide attempt -Tourette's syndrome -weight loss -an unusual or allergic reaction to bupropion, other medicines, foods, dyes, or preservatives -breast-feeding -pregnant or trying to become pregnant How should I use this medicine? Take this medicine by mouth with a glass of water. Follow the directions on the prescription label. You can take it with or without food. If it upsets your stomach, take it with food. Take your medicine at regular intervals. Do not take your medicine more often than directed. Do not stop taking this medicine suddenly except upon the advice of your doctor. Stopping this medicine too quickly may cause serious side effects or your condition may worsen. A special MedGuide will be given to you by the pharmacist with each prescription and refill. Be sure to read this information carefully each time. Talk to your pediatrician regarding the use of this medicine in children. Special care may be needed. Overdosage: If you think you have taken too much of this medicine contact a poison control center or emergency room at once. NOTE: This medicine is only for you. Do not share this medicine with others. What if I miss a dose? If you miss a dose, take it as soon  as you can. If it is less than four hours to your next dose, take only that dose and skip the missed dose. Do not take double or extra doses. What may interact with this medicine? Do not take this medicine with any of the following medications: -linezolid -MAOIs like Azilect, Carbex, Eldepryl, Marplan, Nardil, and Parnate -methylene blue (injected into a vein) -other medicines that contain bupropion like Zyban This medicine may also interact with the following medications: -alcohol -certain medicines for anxiety or sleep -certain medicines for blood pressure like metoprolol, propranolol -certain medicines for depression or psychotic disturbances -certain medicines for HIV or AIDS like efavirenz, lopinavir, nelfinavir, ritonavir -certain medicines for irregular heart beat like propafenone, flecainide -certain medicines for Parkinson's disease like amantadine, levodopa -certain medicines for seizures like carbamazepine, phenytoin, phenobarbital -cimetidine -clopidogrel -cyclophosphamide -furazolidone -isoniazid -nicotine -orphenadrine -procarbazine -steroid medicines like prednisone or cortisone -stimulant medicines for attention disorders, weight loss, or to stay awake -tamoxifen -theophylline -thiotepa -ticlopidine -tramadol -warfarin This list may not describe all possible interactions. Give your health care provider a list of all the medicines, herbs, non-prescription drugs, or dietary supplements you use. Also tell them if you smoke, drink alcohol, or use illegal drugs. Some items may interact with your medicine. What should I watch for while using this medicine? Tell your doctor if your symptoms do not get better or if they get worse. Visit your doctor or health care professional for regular checks on your progress. Because it may take several weeks to see the full effects of this medicine, it is important to continue your treatment as prescribed by your doctor. Patients and  their families should   watch out for new or worsening thoughts of suicide or depression. Also watch out for sudden changes in feelings such as feeling anxious, agitated, panicky, irritable, hostile, aggressive, impulsive, severely restless, overly excited and hyperactive, or not being able to sleep. If this happens, especially at the beginning of treatment or after a change in dose, call your health care professional. Avoid alcoholic drinks while taking this medicine. Drinking excessive alcoholic beverages, using sleeping or anxiety medicines, or quickly stopping the use of these agents while taking this medicine may increase your risk for a seizure. Do not drive or use heavy machinery until you know how this medicine affects you. This medicine can impair your ability to perform these tasks. Do not take this medicine close to bedtime. It may prevent you from sleeping. Your mouth may get dry. Chewing sugarless gum or sucking hard candy, and drinking plenty of water may help. Contact your doctor if the problem does not go away or is severe. What side effects may I notice from receiving this medicine? Side effects that you should report to your doctor or health care professional as soon as possible: -allergic reactions like skin rash, itching or hives, swelling of the face, lips, or tongue -breathing problems -changes in vision -confusion -fast or irregular heartbeat -hallucinations -increased blood pressure -redness, blistering, peeling or loosening of the skin, including inside the mouth -seizures -suicidal thoughts or other mood changes -unusually weak or tired -vomiting Side effects that usually do not require medical attention (report to your doctor or health care professional if they continue or are bothersome): -change in sex drive or performance -constipation -headache -loss of appetite -nausea -tremors -weight loss This list may not describe all possible side effects. Call your doctor  for medical advice about side effects. You may report side effects to FDA at 1-800-FDA-1088. Where should I keep my medicine? Keep out of the reach of children. Store at room temperature between 15 and 25 degrees C (59 and 77 degrees F), away from direct sunlight and moisture. Keep tightly closed. Throw away any unused medicine after the expiration date. NOTE: This sheet is a summary. It may not cover all possible information. If you have questions about this medicine, talk to your doctor, pharmacist, or health care provider.  2015, [employer]vier/Gold Standard. (2012-07-24 12:42:42)  

## 2013-12-17 LAB — CBC
HCT: 37.1 % (ref 36.0–46.0)
HEMOGLOBIN: 12.8 g/dL (ref 12.0–15.0)
MCH: 31 pg (ref 26.0–34.0)
MCHC: 34.5 g/dL (ref 30.0–36.0)
MCV: 89.8 fL (ref 78.0–100.0)
MPV: 10 fL (ref 9.4–12.4)
PLATELETS: 254 10*3/uL (ref 150–400)
RBC: 4.13 MIL/uL (ref 3.87–5.11)
RDW: 13 % (ref 11.5–15.5)
WBC: 6 10*3/uL (ref 4.0–10.5)

## 2013-12-18 LAB — NMR LIPOPROFILE WITH LIPIDS
CHOLESTEROL, TOTAL: 215 mg/dL — AB (ref 100–199)
HDL Particle Number: 53.9 umol/L (ref >=30.5)
HDL SIZE: 9.5 nm (ref >=9.2)
HDL-C: 86 mg/dL (ref >39)
LARGE VLDL-P: 5.9 nmol/L — AB (ref <=2.7)
LDL CALC: 103 mg/dL — AB (ref 0–99)
LDL Particle Number: 1396 nmol/L — ABNORMAL HIGH (ref <1000)
LDL Size: 21.2 nm (ref >=20.8)
LP-IR SCORE: 50 — AB (ref <=45)
Large HDL-P: 13.1 umol/L (ref >=4.8)
SMALL LDL PARTICLE NUMBER: 527 nmol/L (ref <=527)
Triglycerides: 130 mg/dL (ref 0–149)
VLDL SIZE: 55.9 nm — AB (ref <=46.6)

## 2013-12-19 ENCOUNTER — Encounter: Payer: Self-pay | Admitting: Obstetrics & Gynecology

## 2013-12-19 NOTE — Progress Notes (Signed)
Patient ID: Colleen [name], female   DOB: 07-18-1955, 58 y.o.   MRN: [id number]   HPI Colleen [name] is a 58 y.o. female.  She returns for f/u.  HPI  Past Medical History  Diagnosis Date  . PONV (postoperative nausea and vomiting)     severe N/V with last surgery  . Medical history non-contributory   . Insomnia   . Night sweats   . Hot flashes   . Allergy     seasonal  . Urinary frequency     Past Surgical History  Procedure Laterality Date  . Tubal ligation    . Shoulder surgery Bilateral   . Endometrial ablation    . Oophorectomy    . Uterine fibroid surgery    . Robotic assisted total hysterectomy N/A 01/01/2013    Procedure: ROBOTIC ASSISTED TOTAL HYSTERECTOMY;  Surgeon: Lahoma Crocker, MD;  Location: [facility];  Service: Gynecology;  Laterality: N/A;  . Laparoscopic bilateral salpingectomy Right 01/01/2013    Procedure: LAPAROSCOPIC RIGHT SALPINGECTOMY;  Surgeon: Lahoma Crocker, MD;  Location: [facility];  Service: Gynecology;  Laterality: Right;    Family History  Problem Relation Age of Onset  . Arthritis Mother   . Fibromyalgia Mother   . Hypertension Mother   . Autoimmune disease Father     Social History History  Substance Use Topics  . Smoking status: Former Research scientist (life sciences)  . Smokeless tobacco: Never Used  . Alcohol Use: 0.6 oz/week    1 Glasses of wine per week     Comment: occasional wine    Allergies  Allergen Reactions  . Hydrocodone-Acetaminophen Nausea And Vomiting  . Other     Unknown pain medication, dizziness, nausea  . Sulfa Antibiotics Hives  . Adhesive [Tape] Rash  . Ivp Dye [Iodinated Diagnostic Agents] Rash    Current Outpatient Prescriptions  Medication Sig Dispense Refill  . Calcium Carb-Cholecalciferol (CALCIUM 500 +D) 500-400 MG-UNIT TABS Take 2 tablets by mouth daily.    Marland Kitchen estrogens, conjugated, (PREMARIN) 1.25 MG tablet Take 1 tablet (1.25 mg total) by mouth daily. 30 tablet 2  . ibuprofen (ADVIL,MOTRIN) 800 MG tablet Take 1  tablet (800 mg total) by mouth every 8 (eight) hours as needed. 30 tablet 5  . nortriptyline (PAMELOR) 10 MG capsule One po qhs xone week, then 2tabs po qhs 60 capsule 11  . ondansetron (ZOFRAN) 4 MG tablet Take 4 mg by mouth every 8 (eight) hours as needed for nausea or vomiting.    . rizatriptan (MAXALT-MLT) 5 MG disintegrating tablet Take 1 tablet (5 mg total) by mouth as needed. May repeat in 2 hours if needed 15 tablet 6  . TESTOSTERONE TD Place 1 application onto the skin daily. Cream compounded at [facility]    . Estradiol (DIVIGEL) 1 MG/GM GEL Place 1 mg onto the skin daily. (Patient not taking: Reported on 12/16/2013) 30 g 6  . fluticasone (FLONASE) 50 MCG/ACT nasal spray Place 2 sprays into the nose at bedtime.     Marland Kitchen loratadine (CLARITIN) 10 MG tablet Take 10 mg by mouth daily as needed for allergies.    . Multiple Vitamins-Minerals (HAIR/SKIN/NAILS PO) Take 2 tablets by mouth daily. Contains Vit C and Biotin     No current facility-administered medications for this visit.    Review of Systems Review of Systems Constitutional: negative for fatigue and weight loss Respiratory: negative for cough and wheezing Cardiovascular: negative for chest pain, fatigue and palpitations Gastrointestinal: negative for abdominal pain and change in  bowel habits Genitourinary:negative for abnormal vaginal discharge Integument/breast: negative for nipple discharge Musculoskeletal:negative for myalgias Neurological: negative for gait problems and tremors Behavioral/Psych: negative for abusive relationship, depression Endocrine: negative for temperature intolerance     Blood pressure 153/96, pulse 75, weight 76.204 kg (168 lb), last menstrual period 12/04/2012.  Physical Exam Physical Exam General:   alert  Skin:   no rash or abnormalities  Lungs:   clear to auscultation bilaterally  Heart:   regular rate and rhythm, S1, S2 normal, no murmur, click, rub or gallop  Breasts:   normal  without suspicious masses, skin or nipple changes or axillary nodes  Abdomen:  normal findings: no organomegaly, soft, non-tender and no hernia  Pelvis:  External genitalia: normal general appearance Urinary system: urethral meatus normal and bladder without fullness, nontender Vaginal: normal without tenderness, induration or masses Adnexa: normal bimanual exam i       Data Reviewed None  Assessment Normal exam Menopausal symptoms on ERT H/O hypoactive sexual desire    Plan    Orders Placed This Encounter  Procedures  . NMR Lipoprofile with Lipids  . CBC    Possible management options include: alternatives to testosterone replacement reviewed Follow up as needed or in 1 yr        JACKSON-MOORE,Jaiyden Laur A 12/19/2013, 10:54 AM

## 2014-01-10 ENCOUNTER — Encounter: Payer: Self-pay | Admitting: *Deleted

## 2014-01-11 ENCOUNTER — Encounter: Payer: Self-pay | Admitting: Obstetrics & Gynecology

## 2014-01-24 ENCOUNTER — Telehealth: Payer: Self-pay | Admitting: Neurology

## 2014-01-24 NOTE — Telephone Encounter (Signed)
Colleen [name], please call patient,, I do not know any neurologist there, she may ask refer from her local primary care physician

## 2014-01-24 NOTE — Telephone Encounter (Signed)
Called patient and left her a message stating Dr.Yan did not know any doctors in [place] she can call her PCP or call use back with a name and number.

## 2014-01-24 NOTE — Telephone Encounter (Signed)
Patient moved to [place], [place],and requesting a a referral to Neurologist in the area.  Please call and advise.

## 2014-01-26 ENCOUNTER — Other Ambulatory Visit: Payer: Self-pay | Admitting: Obstetrics & Gynecology

## 2014-01-27 ENCOUNTER — Ambulatory Visit: Payer: 59 | Admitting: Neurology

## 2014-01-27 NOTE — Telephone Encounter (Signed)
Please advise 

## 2014-01-28 ENCOUNTER — Ambulatory Visit: Payer: 59 | Admitting: Neurology

## 2014-04-21 ENCOUNTER — Encounter: Payer: Self-pay | Admitting: Internal Medicine
